# Patient Record
Sex: Female | Born: 1960
Health system: Southern US, Community
[De-identification: ages and names within clinical notes are randomized; demographics above are authoritative.]

## PROBLEM LIST (undated history)

## (undated) DIAGNOSIS — D219 Benign neoplasm of connective and other soft tissue, unspecified: Secondary | ICD-10-CM

## (undated) DIAGNOSIS — D649 Anemia, unspecified: Secondary | ICD-10-CM

## (undated) DIAGNOSIS — I10 Essential (primary) hypertension: Secondary | ICD-10-CM

## (undated) DIAGNOSIS — Z872 Personal history of diseases of the skin and subcutaneous tissue: Secondary | ICD-10-CM

## (undated) DIAGNOSIS — E785 Hyperlipidemia, unspecified: Secondary | ICD-10-CM

## (undated) HISTORY — DX: Anemia, unspecified: D64.9

## (undated) HISTORY — DX: Benign neoplasm of connective and other soft tissue, unspecified: D21.9

## (undated) HISTORY — DX: Hyperlipidemia, unspecified: E78.5

## (undated) HISTORY — PX: CYSTECTOMY: SUR359

## (undated) HISTORY — DX: Essential (primary) hypertension: I10

## (undated) HISTORY — DX: Personal history of diseases of the skin and subcutaneous tissue: Z87.2

---

## 1999-08-15 ENCOUNTER — Other Ambulatory Visit: Admission: RE | Admit: 1999-08-15 | Discharge: 1999-08-15 | Payer: Self-pay | Admitting: Obstetrics and Gynecology

## 2000-09-15 ENCOUNTER — Other Ambulatory Visit: Admission: RE | Admit: 2000-09-15 | Discharge: 2000-09-15 | Payer: Self-pay | Admitting: Obstetrics and Gynecology

## 2000-10-24 ENCOUNTER — Ambulatory Visit (HOSPITAL_COMMUNITY): Admission: RE | Admit: 2000-10-24 | Discharge: 2000-10-24 | Payer: Self-pay | Admitting: Obstetrics and Gynecology

## 2001-03-10 ENCOUNTER — Encounter: Payer: Self-pay | Admitting: Family Medicine

## 2001-03-10 ENCOUNTER — Ambulatory Visit (HOSPITAL_COMMUNITY): Admission: RE | Admit: 2001-03-10 | Discharge: 2001-03-10 | Payer: Self-pay | Admitting: Family Medicine

## 2001-09-09 ENCOUNTER — Other Ambulatory Visit: Admission: RE | Admit: 2001-09-09 | Discharge: 2001-09-09 | Payer: Self-pay | Admitting: Obstetrics and Gynecology

## 2001-10-05 ENCOUNTER — Ambulatory Visit (HOSPITAL_COMMUNITY): Admission: RE | Admit: 2001-10-05 | Discharge: 2001-10-05 | Payer: Self-pay | Admitting: Obstetrics and Gynecology

## 2001-10-05 ENCOUNTER — Encounter: Payer: Self-pay | Admitting: Obstetrics and Gynecology

## 2002-10-04 ENCOUNTER — Other Ambulatory Visit: Admission: RE | Admit: 2002-10-04 | Discharge: 2002-10-04 | Payer: Self-pay | Admitting: Obstetrics and Gynecology

## 2002-10-07 ENCOUNTER — Encounter: Payer: Self-pay | Admitting: Obstetrics and Gynecology

## 2002-10-07 ENCOUNTER — Ambulatory Visit (HOSPITAL_COMMUNITY): Admission: RE | Admit: 2002-10-07 | Discharge: 2002-10-07 | Payer: Self-pay | Admitting: Obstetrics and Gynecology

## 2003-11-22 ENCOUNTER — Other Ambulatory Visit: Admission: RE | Admit: 2003-11-22 | Discharge: 2003-11-22 | Payer: Self-pay | Admitting: Obstetrics and Gynecology

## 2003-12-07 ENCOUNTER — Ambulatory Visit (HOSPITAL_COMMUNITY): Admission: RE | Admit: 2003-12-07 | Discharge: 2003-12-07 | Payer: Self-pay | Admitting: Obstetrics and Gynecology

## 2004-12-27 ENCOUNTER — Other Ambulatory Visit: Admission: RE | Admit: 2004-12-27 | Discharge: 2004-12-27 | Payer: Self-pay | Admitting: Obstetrics and Gynecology

## 2005-01-03 ENCOUNTER — Ambulatory Visit (HOSPITAL_COMMUNITY): Admission: RE | Admit: 2005-01-03 | Discharge: 2005-01-03 | Payer: Self-pay | Admitting: Obstetrics and Gynecology

## 2005-12-30 ENCOUNTER — Other Ambulatory Visit: Admission: RE | Admit: 2005-12-30 | Discharge: 2005-12-30 | Payer: Self-pay | Admitting: Obstetrics and Gynecology

## 2006-02-25 ENCOUNTER — Ambulatory Visit (HOSPITAL_COMMUNITY): Admission: RE | Admit: 2006-02-25 | Discharge: 2006-02-25 | Payer: Self-pay | Admitting: Obstetrics and Gynecology

## 2007-03-02 ENCOUNTER — Ambulatory Visit (HOSPITAL_COMMUNITY): Admission: RE | Admit: 2007-03-02 | Discharge: 2007-03-02 | Payer: Self-pay | Admitting: Obstetrics and Gynecology

## 2008-04-07 ENCOUNTER — Ambulatory Visit (HOSPITAL_COMMUNITY): Admission: RE | Admit: 2008-04-07 | Discharge: 2008-04-07 | Payer: Self-pay | Admitting: Obstetrics and Gynecology

## 2009-05-22 ENCOUNTER — Ambulatory Visit (HOSPITAL_COMMUNITY): Admission: RE | Admit: 2009-05-22 | Discharge: 2009-05-22 | Payer: Self-pay | Admitting: Obstetrics and Gynecology

## 2009-06-02 ENCOUNTER — Encounter: Admission: RE | Admit: 2009-06-02 | Discharge: 2009-06-02 | Payer: Self-pay | Admitting: Obstetrics and Gynecology

## 2010-06-25 ENCOUNTER — Encounter
Admission: RE | Admit: 2010-06-25 | Discharge: 2010-06-25 | Payer: Self-pay | Source: Home / Self Care | Attending: Obstetrics and Gynecology | Admitting: Obstetrics and Gynecology

## 2010-07-29 ENCOUNTER — Encounter: Payer: Self-pay | Admitting: Obstetrics and Gynecology

## 2010-11-23 NOTE — Op Note (Signed)
[facility]  Patient:    Kimberly Bonilla, Kimberly Bonilla                         MRN: [id number] Proc. Date: 10/24/00 Adm. Date:  [id number] Attending:  Leonard Schwartz                           Operative Report  PREOPERATIVE DIAGNOSES:       1. Desires sterilization.                               2. Fibroids.  POSTOPERATIVE DIAGNOSES:      1. Desires sterilization.                               2. Fibroids.  PROCEDURE:                    Laparoscopic tubal cautery surgery.  SURGEON:                      Janine Limbo, M.D.  FIRST ASSISTANT:              None.  ANESTHESIA:                   General.  DISPOSITION:                  Ms. Roes is a 50 year old female, para 1-0-0-1, who desires sterilization.  She understands the indications for her procedure, and she accepts the risks of, but no limited to, anesthetic complications, bleeding, infections, and possible damage to the surrounding organs, and possible tubal failure (10-17:1,000).  FINDINGS:                     The uterus was 12-weeks size and had multiple subserosal fibroids.  The fallopian tubes and ovaries appeared normal.  The bowel, the appendix and the upper abdomen appeared normal.  PROCEDURE:                    The patient was taken to the operating room, where a general anesthetic was given.  The patients abdomen, perineum, and vagina were prepped with multiple layers of Betadine.  The bladder was drained of urine.  A Hulka tenaculum was placed inside the uterus.  The patient was sterilely draped.  The subumbilical area was injected with 5 cc of 0.5% Marcaine.  An incision was made and the Veress needle was inserted without difficulty.  Proper insufflation was confirmed using a saline drop test.  A pneumoperitoneum was obtained.  The laparoscopic trocar and then the laparoscope were substituted for the Veress needle.  The pelvic structures were visualized, with findings as mentioned  above.  The right fallopian tube was identified and followed to its fimbriated end.  The proximal portion of the right fallopian tube was cauterized in several segments.  Hemostasis was adequate.  An identical procedure was carried out on the opposite side. Again, hemostasis was adequate.  This having been completed, the bowel was carefully inspected and there was no evidence of damage.  The pneumoperitoneum was allowed to escape.  All instruments were removed.  The incision was closed using deep and superficial sutures of 4-0 Vicryl.  Sponge, needle and instrument  counts were correct on two occasions.  ESTIMATED BLOOD LOSS:         5-10 cc.  DISPOSITION:                  The patient tolerated her procedure well.  FOLLOW-UP INSTRUCTIONS:       The patient will return to see Dr. Stefano Gaul in two to three weeks for follow-up examination.  She was given a prescription for Vicodin 1-2 p.o. q.4h. p.r.n. pain.  She was given a copy of the postoperative instruction sheet, as prepared by the [facility] for patients who have undergone laparoscopy. DD:  10/24/00 TD:  10/25/00 Job: [id number]

## 2010-11-23 NOTE — H&P (Signed)
[facility]  Patient:    Kimberly Bonilla, Kimberly Bonilla                           MRN: [id number] Adm. Date:  10/24/00 Attending:  Janine Limbo, M.D.                         History and Physical  HISTORY OF PRESENT ILLNESS:   Kimberly Bonilla is a 50 year old female, para 1-0-0-1 who desires permanent sterilization.  OB/GYN HISTORY:               The patient had a vaginal delivery at term in January of 1995.  PAST MEDICAL HISTORY:         The patient had breast cysts removed in 1980 and again in 1987.  CURRENT MEDICATIONS:          Aviane. She has elevated cholesterol.  ALLERGIES:                    No known drug allergies.  SOCIAL HISTORY:               The patient drinks alcohol socially. She denies cigarette use and recreational drug use.  REVIEW OF SYSTEMS:            Noncontributory.  FAMILY HISTORY:               The patients sister has Parkinsons disease. She has several sisters with thyroid disorder. The patients mother and sisters have diabetes. The patients mother has hypertension. The patients mother had uterine cancer.  PHYSICAL EXAMINATION:  VITAL SIGNS:                  Weight 147 pounds.  HEENT:                        Within normal limits.  LUNGS:                        Chest is clear.  CARDIOVASCULAR:               Regular rate and rhythm.  BREASTS:                      Without masses.  ABDOMEN:                      Nontender.  EXTREMITIES:                  Within normal limits.  NEUROLOGIC:                   Normal exam.  PELVIC:                       External genitalia is normal. Vaginal is normal. Cervix is nontender. Uterus is normal size, shape and consistency. Adnexa no masses. Rectovaginal exam confirms.  ASSESSMENT:                   Desires permanent sterilization.  PLAN:                         The patient will undergo a laparoscopic tubal cautery. She understands the indications for this procedure and she accepts the  risks of, but not limited  to anesthetic complications, bleeding, infections and possible damage to the surrounding organs. DD:  10/23/00 TD:  10/23/00 Job: [id number]

## 2011-06-04 ENCOUNTER — Other Ambulatory Visit: Payer: Self-pay | Admitting: Obstetrics and Gynecology

## 2011-06-04 DIAGNOSIS — Z1231 Encounter for screening mammogram for malignant neoplasm of breast: Secondary | ICD-10-CM

## 2011-06-27 ENCOUNTER — Ambulatory Visit
Admission: RE | Admit: 2011-06-27 | Discharge: 2011-06-27 | Disposition: A | Discharge status: Home / Self Care | Payer: Private Health Insurance - Indemnity | Source: Ambulatory Visit | Attending: Obstetrics and Gynecology | Admitting: Obstetrics and Gynecology

## 2011-06-27 DIAGNOSIS — Z1231 Encounter for screening mammogram for malignant neoplasm of breast: Secondary | ICD-10-CM

## 2012-06-08 ENCOUNTER — Ambulatory Visit: Payer: Self-pay | Admitting: Obstetrics and Gynecology

## 2012-07-23 ENCOUNTER — Ambulatory Visit: Payer: Self-pay | Admitting: Obstetrics and Gynecology

## 2012-08-19 ENCOUNTER — Ambulatory Visit: Payer: Self-pay | Admitting: Obstetrics and Gynecology

## 2012-09-03 ENCOUNTER — Encounter: Payer: Self-pay | Admitting: Obstetrics and Gynecology

## 2012-09-03 ENCOUNTER — Ambulatory Visit: Payer: Managed Care, Other (non HMO) | Admitting: Obstetrics and Gynecology

## 2012-09-03 VITALS — BP 128/80 | Resp 18 | Ht 66.0 in | Wt 178.0 lb

## 2012-09-03 DIAGNOSIS — E663 Overweight: Secondary | ICD-10-CM | POA: Insufficient documentation

## 2012-09-03 DIAGNOSIS — I10 Essential (primary) hypertension: Secondary | ICD-10-CM | POA: Insufficient documentation

## 2012-09-03 DIAGNOSIS — N898 Other specified noninflammatory disorders of vagina: Secondary | ICD-10-CM

## 2012-09-03 DIAGNOSIS — Z01419 Encounter for gynecological examination (general) (routine) without abnormal findings: Secondary | ICD-10-CM

## 2012-09-03 DIAGNOSIS — Z124 Encounter for screening for malignant neoplasm of cervix: Secondary | ICD-10-CM

## 2012-09-03 LAB — POCT WET PREP (WET MOUNT): Whiff Test: POSITIVE

## 2012-09-03 MED ORDER — METRONIDAZOLE 500 MG PO TABS: 500.0000 mg | ORAL_TABLET | Freq: Two times a day (BID) | ORAL | Status: DC

## 2012-09-03 MED ORDER — METRONIDAZOLE 500 MG PO TABS: 500.0000 mg | ORAL_TABLET | Freq: Two times a day (BID) | ORAL | Status: AC

## 2012-09-03 NOTE — Patient Instructions (Signed)
Bacterial Vaginosis Bacterial vaginosis (BV) is a vaginal infection where the normal balance of bacteria in the vagina is disrupted. The normal balance is then replaced by an overgrowth of certain bacteria. There are several different kinds of bacteria that can cause BV. BV is the most common vaginal infection in women of childbearing age. CAUSES   The cause of BV is not fully understood. BV develops when there is an increase or imbalance of harmful bacteria.  Some activities or behaviors can upset the normal balance of bacteria in the vagina and put women at increased risk including:  Having a new sex partner or multiple sex partners.  Douching.  Using an intrauterine device (IUD) for contraception.  It is not clear what role sexual activity plays in the development of BV. However, women that have never had sexual intercourse are rarely infected with BV. Women do not get BV from toilet seats, bedding, swimming pools or from touching objects around them.  SYMPTOMS   Grey vaginal discharge.  A fish-like odor with discharge, especially after sexual intercourse.  Itching or burning of the vagina and vulva.  Burning or pain with urination.  Some women have no signs or symptoms at all. DIAGNOSIS  Your caregiver must examine the vagina for signs of BV. Your caregiver will perform lab tests and look at the sample of vaginal fluid through a microscope. They will look for bacteria and abnormal cells (clue cells), a pH test higher than 4.5, and a positive amine test all associated with BV.  RISKS AND COMPLICATIONS   Pelvic inflammatory disease (PID).  Infections following gynecology surgery.  Developing HIV.  Developing herpes virus. TREATMENT  Sometimes BV will clear up without treatment. However, all women with symptoms of BV should be treated to avoid complications, especially if gynecology surgery is planned. Female partners generally do not need to be treated. However, BV may spread  between female sex partners so treatment is helpful in preventing a recurrence of BV.   BV may be treated with antibiotics. The antibiotics come in either pill or vaginal cream forms. Either can be used with nonpregnant or pregnant women, but the recommended dosages differ. These antibiotics are not harmful to the baby.  BV can recur after treatment. If this happens, a second round of antibiotics will often be prescribed.  Treatment is important for pregnant women. If not treated, BV can cause a premature delivery, especially for a pregnant woman who had a premature birth in the past. All pregnant women who have symptoms of BV should be checked and treated.  For chronic reoccurrence of BV, treatment with a type of prescribed gel vaginally twice a week is helpful. HOME CARE INSTRUCTIONS   Finish all medication as directed by your caregiver.  Do not have sex until treatment is completed.  Tell your sexual partner that you have a vaginal infection. They should see their caregiver and be treated if they have problems, such as a mild rash or itching.  Practice safe sex. Use condoms. Only have 1 sex partner. PREVENTION  Basic prevention steps can help reduce the risk of upsetting the natural balance of bacteria in the vagina and developing BV:  Do not have sexual intercourse (be abstinent).  Do not douche.  Use all of the medicine prescribed for treatment of BV, even if the signs and symptoms go away.  Tell your sex partner if you have BV. That way, they can be treated, if needed, to prevent reoccurrence. SEEK MEDICAL CARE IF:     Your symptoms are not improving after 3 days of treatment.  You have increased discharge, pain, or fever. MAKE SURE YOU:   Understand these instructions.  Will watch your condition.  Will get help right away if you are not doing well or get worse. FOR MORE INFORMATION  Division of STD Prevention (DSTDP), Centers for Disease Control and Prevention:  www.cdc.gov/std American Social Health Association (ASHA): www.ashastd.org  Document Released: 06/24/2005 Document Revised: 09/16/2011 Document Reviewed: 12/15/2008 Exit[employer] Patient Information 2013 [employer].  

## 2012-09-03 NOTE — Progress Notes (Signed)
ANNUAL GYNECOLOGIC EXAMINATION   Kimberly Bonilla is a 52 y.o. female, G1P0, who presents for an annual exam. She has a known history of fibroids.  With her last menstrual cycle she had spotting after her normal period. She now has a vaginal discharge with an odor.  She has trouble sleeping.  Minimal hot flashes.    History   Social History  . Marital Status: Married    Spouse Name: N/A    Number of Children: N/A  . Years of Education: N/A   Social History Main Topics  . Smoking status: Never Smoker   . Smokeless tobacco: Never Used  . Alcohol Use: No  . Drug Use: No  . Sexually Active: Yes -- Female partner(s)    Birth Control/ Protection: Surgical     Comment: btl   Other Topics Concern  . None   Social History Narrative  . None    Menstrual cycle:   LMP: No LMP recorded.             The following portions of the patient's history were reviewed and updated as appropriate: allergies, current medications, past family history, past medical history, past social history, past surgical history and problem list.  Review of Systems Pertinent items are noted in HPI. Breast:Negative for breast lump,nipple discharge or nipple retraction Gastrointestinal: Negative for abdominal pain, change in bowel habits or rectal bleeding Urinary: some stress urinary incontinence   Objective:    BP 128/80  Resp 18  Ht 5\' 6"  (1.676 m)  Wt 178 lb (80.74 kg)  BMI 28.74 kg/m2    Weight:  Wt Readings from Last 1 Encounters:  09/03/12 178 lb (80.74 kg)          BMI: Body mass index is 28.74 kg/(m^2).  General Appearance: Alert, appropriate appearance for age. No acute distress HEENT: Grossly normal Neck / Thyroid: Supple, no masses, nodes or enlargement Lungs: clear to auscultation bilaterally Back: No CVA tenderness Breast Exam: No masses or nodes.No dimpling, nipple retraction or discharge. Cardiovascular: Regular rate and rhythm. S1, S2, no murmur Gastrointestinal: Soft, non-tender, no  masses or organomegaly  ++++++++++++++++++++++++++++++++++++++++++++++++++++++++  Pelvic Exam: External genitalia: normal general appearance Vaginal: normal without tenderness, induration or masses. Relaxation: Yes Cervix: normal appearance Adnexa: normal bimanual exam Uterus: 10 weeks size, firm Rectovaginal: no masses  ++++++++++++++++++++++++++++++++++++++++++++++++++++++++  Lymphatic Exam: Non-palpable nodes in neck, clavicular, axillary, or inguinal regions Neurologic: Normal speech, no tremor  Psychiatric: Alert and oriented, appropriate affect.   Wet prep:Whiff positive, pH 5.5, clue cells present, negative trichomoniasis Assessment:    Normal gyn exam   Overweight or obese: Yes   Pelvic relaxation: Yes  Fibroid uterus  Perimenopause  Bacterial vaginosis  Spotting after her cycle  hypertension   Plan:    mammogram pap smear return annually or prn Contraception:bilateral tubal ligation    Medications prescribed: Metronidazole 500 mg twice a day for 7 days  STD screen request: Yes, gonorrhea and Chlamydia  The patient will keep a calendar of her menstrual cycles.  If she continues to have spotting immediately after her cycle, then she will return so that we can do an ultrasound.  An ultrasound was offered today.  She is comfortable observing only for the moment.  The updated Pap smear screening guidelines were discussed with the patient. The patient requested that I obtain a Pap smear: Yes.  Kegel exercises discussed: Yes.  Proper diet and regular exercise were reviewed.  Annual mammograms recommended starting at age 76. Proper  breast care was discussed.  Screening colonoscopy is recommended beginning at age 79.  Regular health maintenance was reviewed.  Sleep hygiene was discussed.  Adequate calcium and vitamin D intake was emphasized.  Leonard Schwartz M.D.    Regular Periods: yes last cycle was irregular unable to predict it.   Mammogram: no  Monthly Breast Ex.: no Exercise: yes "Walking during breaks at work x 5 days a week"  Tetanus < 10 years: yes Seatbelts: yes  NI. Bladder Functn.: yes "leaking w/cough/laughing" Abuse at home: no  Daily BM's: yes Stressful Work: no  Healthy Diet: yes Sigmoid-Colonoscopy: never per pt   Calcium: yes Medical problems this year: Vaginal discharge, spotting.    LAST PAP:05/2010 due today   Contraception: BTL   Mammogram:  2013 "WNL"  PCP: Noberto Retort  PMH: Unchanged  FMH: Unchanged  Last Bone Scan: Never per pt.

## 2012-09-04 ENCOUNTER — Other Ambulatory Visit: Payer: Self-pay | Admitting: Obstetrics and Gynecology

## 2012-09-04 ENCOUNTER — Other Ambulatory Visit: Payer: Self-pay

## 2012-09-04 DIAGNOSIS — Z1231 Encounter for screening mammogram for malignant neoplasm of breast: Secondary | ICD-10-CM

## 2012-09-04 LAB — PAP IG, CT-NG, RFX HPV ASCU: Chlamydia Probe Amp: NEGATIVE

## 2012-09-22 ENCOUNTER — Ambulatory Visit
Admission: RE | Admit: 2012-09-22 | Discharge: 2012-09-22 | Disposition: A | Discharge status: Home / Self Care | Payer: Managed Care, Other (non HMO) | Source: Ambulatory Visit

## 2012-09-22 DIAGNOSIS — Z1231 Encounter for screening mammogram for malignant neoplasm of breast: Secondary | ICD-10-CM

## 2013-11-02 ENCOUNTER — Other Ambulatory Visit: Payer: Self-pay

## 2013-11-02 DIAGNOSIS — Z1231 Encounter for screening mammogram for malignant neoplasm of breast: Secondary | ICD-10-CM

## 2013-11-17 ENCOUNTER — Ambulatory Visit
Admission: RE | Admit: 2013-11-17 | Discharge: 2013-11-17 | Disposition: A | Discharge status: Home / Self Care | Payer: Managed Care, Other (non HMO) | Source: Ambulatory Visit

## 2013-11-17 ENCOUNTER — Encounter (INDEPENDENT_AMBULATORY_CARE_PROVIDER_SITE_OTHER): Payer: Self-pay

## 2013-11-17 DIAGNOSIS — Z1231 Encounter for screening mammogram for malignant neoplasm of breast: Secondary | ICD-10-CM

## 2014-05-09 ENCOUNTER — Encounter: Payer: Self-pay | Admitting: Obstetrics and Gynecology

## 2014-10-14 ENCOUNTER — Other Ambulatory Visit: Payer: Self-pay

## 2014-10-14 DIAGNOSIS — Z1231 Encounter for screening mammogram for malignant neoplasm of breast: Secondary | ICD-10-CM

## 2014-11-25 ENCOUNTER — Ambulatory Visit: Payer: Managed Care, Other (non HMO)

## 2014-12-08 ENCOUNTER — Ambulatory Visit: Payer: Managed Care, Other (non HMO)

## 2014-12-26 ENCOUNTER — Ambulatory Visit
Admission: RE | Admit: 2014-12-26 | Discharge: 2014-12-26 | Disposition: A | Discharge status: Home / Self Care | Payer: BLUE CROSS/BLUE SHIELD | Source: Ambulatory Visit

## 2014-12-26 DIAGNOSIS — Z1231 Encounter for screening mammogram for malignant neoplasm of breast: Secondary | ICD-10-CM

## 2015-10-17 DIAGNOSIS — Z0000 Encounter for general adult medical examination without abnormal findings: Secondary | ICD-10-CM | POA: Diagnosis not present

## 2015-10-17 DIAGNOSIS — E782 Mixed hyperlipidemia: Secondary | ICD-10-CM | POA: Diagnosis not present

## 2015-10-17 DIAGNOSIS — I10 Essential (primary) hypertension: Secondary | ICD-10-CM | POA: Diagnosis not present

## 2015-10-17 DIAGNOSIS — K219 Gastro-esophageal reflux disease without esophagitis: Secondary | ICD-10-CM | POA: Diagnosis not present

## 2015-11-10 ENCOUNTER — Ambulatory Visit: Payer: Worker's Compensation | Admitting: Family Medicine

## 2015-11-10 ENCOUNTER — Ambulatory Visit: Payer: BLUE CROSS/BLUE SHIELD

## 2015-11-10 VITALS — BP 140/86 | HR 80 | Temp 98.8°F | Resp 16 | Ht 66.0 in | Wt 185.0 lb

## 2015-11-10 DIAGNOSIS — S8991XA Unspecified injury of right lower leg, initial encounter: Secondary | ICD-10-CM | POA: Diagnosis not present

## 2015-11-10 MED ORDER — ACETAMINOPHEN-CODEINE #3 300-30 MG PO TABS: 1.0000 | ORAL_TABLET | Freq: Four times a day (QID) | ORAL | Status: AC | PRN

## 2015-11-10 NOTE — Patient Instructions (Addendum)
Elastic Bandage and RICE WHAT DOES AN ELASTIC BANDAGE DO? Elastic bandages come in different shapes and sizes. They generally provide support to your injury and reduce swelling while you are healing, but they can perform different functions. Your health care provider will help you to decide what is best for your protection, recovery, or rehabilitation following an injury. WHAT ARE SOME GENERAL TIPS FOR USING AN ELASTIC BANDAGE?  Use the bandage as directed by the maker of the bandage that you are using.  Do not wrap the bandage too tightly. This may cut off the circulation in the arm or leg in the area below the bandage.  If part of your body beyond the bandage becomes blue, numb, cold, swollen, or is more painful, your bandage is most likely too tight. If this occurs, remove your bandage and reapply it more loosely.  See your health care provider if the bandage seems to be making your problems worse rather than better.  An elastic bandage should be removed and reapplied every 3-4 hours or as directed by your health care provider. WHAT IS RICE? The routine care of many injuries includes rest, ice, compression, and elevation (RICE therapy).  Rest Rest is required to allow your body to heal. Generally, you can resume your routine activities when you are comfortable and have been given permission by your health care provider. Ice Icing your injury helps to keep the swelling down and it reduces pain. Do not apply ice directly to your skin.  Put ice in a plastic bag.  Place a towel between your skin and the bag.  Leave the ice on for 20 minutes, 2-3 times per day. Do this for as long as you are directed by your health care provider. Compression Compression helps to keep swelling down, gives support, and helps with discomfort. Compression may be done with an elastic bandage. Elevation Elevation helps to reduce swelling and it decreases pain. If possible, your injured area should be placed  at or above the level of your heart or the center of your chest. [place]? You should seek medical care if:  You have persistent pain and swelling.  Your symptoms are getting worse rather than improving. These symptoms may indicate that further evaluation or further X-rays are needed. Sometimes, X-rays may not show a small broken bone (fracture) until a number of days later. Make a follow-up appointment with your health care provider. Ask when your X-ray results will be ready. Make sure that you get your X-ray results. WHEN SHOULD I SEEK IMMEDIATE MEDICAL CARE? You should seek immediate medical care if:  You have a sudden onset of severe pain at or below the area of your injury.  You develop redness or increased swelling around your injury.  You have tingling or numbness at or below the area of your injury that does not improve after you remove the elastic bandage.   This information is not intended to replace advice given to you by your health care provider. Make sure you discuss any questions you have with your health care provider.   Document Released: 12/14/2001 Document Revised: 03/15/2015 Document Reviewed: 02/07/2014 Elsevier Interactive Patient Education 2016 [employer].    IF you received an x-ray today, you will receive an invoice from [facility]. Please contact [facility] at [phone] with questions or concerns regarding your invoice.   IF you received labwork today, you will receive an invoice from [employer]. Please contact [facility] at [phone]  with questions or concerns regarding your invoice.   Our billing staff will not be able to assist you with questions regarding bills from these companies.  You will be contacted with the lab results as soon as they are available. The fastest way to get your results is to activate your My Chart account. Instructions are located on the last page of this  paperwork. If you have not heard from [place] regarding the results in 2 weeks, please contact this office.     Prepatellar Bursitis With Rehab  Bursitis is a condition that is characterized by inflammation of a bursa. Saunders Revel exists in many areas of the body. They are fluid-filled sacs that lie between a soft tissue (skin, tendon, or ligament) and a bone, and they reduce friction between the structures as well as the stress placed on the soft tissue. Prepatellar bursitis is inflammation of the bursa that lies between the skin and the kneecap (patella). This condition often causes pain over the patella. SYMPTOMS   Pain, tenderness, and/or inflammation over the patella.  Pain that worsens with movement of the knee joint.  Decreased range of motion for the knee joint.  A crackling sound (crepitation) when the bursa is moved or touched.  Occasionally, painless swelling of the bursa.  Fever (when infected). CAUSES  Bursitis is caused by damage to the bursa, which results in an inflammatory response. Common mechanisms of injury include:  Direct trauma to the front of the knee.  Repetitive and/or stressful use of the knee. RISK INCREASES WITH:  Activities in which kneeling and/or falling on one's knees is likely (volleyball or football).  Repetitive and stressful training, especially if it involves running on hills.  Improper training techniques, such as a sudden increase in the intensity, frequency, or duration of training.  Failure to warm up properly before activity.  Poor technique.  Artificial turf. PREVENTION   Avoid kneeling or falling on your knees.  Warm up and stretch properly before activity.  Allow for adequate recovery between workouts.  Maintain physical fitness:  Strength, flexibility, and endurance.  Cardiovascular fitness.  Learn and use proper technique. When possible, have a coach correct improper technique.  Wear properly fitted and padded protective  equipment (knee pads). PROGNOSIS  If treated properly, then the symptoms of prepatellar bursitis usually resolve within 2 weeks. RELATED COMPLICATIONS   Recurrent symptoms that result in a chronic problem.  Prolonged healing time, if improperly treated or reinjured.  Limited range of motion.  Infection of bursa.  Chronic inflammation or scarring of bursa. TREATMENT  Treatment initially involves the use of ice and medication to help reduce pain and inflammation. The use of strengthening and stretching exercises may help reduce pain with activity, especially those of the quadriceps and hamstring muscles. These exercises may be performed at home or with referral to a therapist. Your caregiver may recommend knee pads when you return to playing sports, in order to reduce the stress on the prepatellar bursa. If symptoms persist despite treatment, then your caregiver may drain fluid out with a needle (aspirate) the bursa. If symptoms persist for greater than 6 months despite nonsurgical (conservative) treatment, then surgery may be recommended to remove the bursa.  MEDICATION  If pain medication is necessary, then nonsteroidal anti-inflammatory medications, such as aspirin and ibuprofen, or other minor pain relievers, such as acetaminophen, are often recommended.  Do not take pain medication for 7 days before surgery.  Prescription pain relievers may be given if deemed necessary by your caregiver.  Use only as directed and only as much as you need.  Corticosteroid injections may be given by your caregiver. These injections should be reserved for the most serious cases, because they may only be given a certain number of times. HEAT AND COLD  Cold treatment (icing) relieves pain and reduces inflammation. Cold treatment should be applied for 10 to 15 minutes every 2 to 3 hours for inflammation and pain and immediately after any activity that aggravates your symptoms. Use ice packs or massage the area  with a piece of ice (ice massage).  Heat treatment may be used prior to performing the stretching and strengthening activities prescribed by your caregiver, physical therapist, or athletic trainer. Use a heat pack or soak the injury in warm water. SEEK MEDICAL CARE IF:  Treatment seems to offer no benefit, or the condition worsens.  Any medications produce adverse side effects. EXERCISES RANGE OF MOTION (ROM) AND STRETCHING EXERCISES - Prepatellar Bursitis These exercises may help you when beginning to rehabilitate your injury. Your symptoms may resolve with or without further involvement from your physician, physical therapist or athletic trainer. While completing these exercises, remember:   Restoring tissue flexibility helps normal motion to return to the joints. This allows healthier, less painful movement and activity.  An effective stretch should be held for at least 30 seconds.  A stretch should never be painful. You should only feel a gentle lengthening or release in the stretched tissue. STRETCH - Hamstrings, Standing  Stand or sit and extend your right / left leg, placing your foot on a chair or foot stool  Keeping a slight arch in your low back and your hips straight forward.  Lead with your chest and lean forward at the waist until you feel a gentle stretch in the back of your right / left knee or thigh. (When done correctly, this exercise requires leaning only a small distance.)  Hold this position for __________ seconds. Repeat __________ times. Complete this stretch __________ times per day. STRETCH - Quadriceps, Prone   Lie on your stomach on a firm surface, such as a bed or padded floor.  Bend your right / left knee and grasp your ankle. If you are unable to reach, your ankle or pant leg, use a belt around your foot to lengthen your reach.  Gently pull your heel toward your buttocks. Your knee should not slide out to the side. You should feel a stretch in the front  of your thigh and/or knee.  Hold this position for __________ seconds. Repeat __________ times. Complete this stretch __________ times per day.  STRETCH - Hamstrings/Adductors, V-Sit   Sit on the floor with your legs extended in a large "V," keeping your knees straight.  With your head and chest upright, bend at your waist reaching for your right foot to stretch your left adductors.  You should feel a stretch in your left inner thigh. Hold for __________ seconds.  Return to the upright position to relax your leg muscles.  Continuing to keep your chest upright, bend straight forward at your waist to stretch your hamstrings.  You should feel a stretch behind both of your thighs and/or knees. Hold for __________ seconds.  Return to the upright position to relax your leg muscles.  Repeat steps 2 through 4. Repeat __________ times. Complete this exercise __________ times per day.  STRENGTHENING EXERCISES - Prepatellar Bursitis  These exercises may help you when beginning to rehabilitate your injury. They may resolve your symptoms with or  without further involvement from your physician, physical therapist or athletic trainer. While completing these exercises, remember:  Muscles can gain both the endurance and the strength needed for everyday activities through controlled exercises.  Complete these exercises as instructed by your physician, physical therapist or athletic trainer. Progress the resistance and repetitions only as guided. STRENGTH - Quadriceps, Isometrics  Lie on your back with your right / left leg extended and your opposite knee bent.  Gradually tense the muscles in the front of your right / left thigh. You should see either your kneecap slide up toward your hip or increased dimpling just above the knee. This motion will push the back of the knee down toward the floor/mat/bed on which you are lying.  Hold the muscle as tight as you can without increasing your pain for  __________ seconds.  Relax the muscles slowly and completely in between each repetition. Repeat __________ times. Complete this exercise __________ times per day.  STRENGTH - Quadriceps, Short Arcs   Lie on your back. Place a __________ inch towel roll under your knee so that the knee slightly bends.  Raise only your lower leg by tightening the muscles in the front of your thigh. Do not allow your thigh to rise.  Hold this position for __________ seconds. Repeat __________ times. Complete this exercise __________ times per day.  OPTIONAL ANKLE WEIGHTS: Begin with ____________________, but DO NOT exceed ____________________. Increase in1 lb/0.5 kg increments.  STRENGTH - Quadriceps, Straight Leg Raises  Quality counts! Watch for signs that the quadriceps muscle is working to insure you are strengthening the correct muscles and not "cheating" by substituting with healthier muscles.  Lay on your back with your right / left leg extended and your opposite knee bent.  Tense the muscles in the front of your right / left thigh. You should see either your kneecap slide up or increased dimpling just above the knee. Your thigh may even quiver.  Tighten these muscles even more and raise your leg 4 to 6 inches off the floor. Hold for __________ seconds.  Keeping these muscles tense, lower your leg.  Relax the muscles slowly and completely in between each repetition. Repeat __________ times. Complete this exercise __________ times per day.  STRENGTH - Quadriceps, Step-Ups   Use a thick book, step or step stool that is __________ inches tall.  Holding a [place] or counter for balance only, not support.  Slowly step-up with your right / left foot, keeping your knee in line with your hip and foot. Do not allow your knee to bend so far that you cannot see your toes.  Slowly unlock your knee and lower yourself to the starting position. Your muscles, not gravity, should lower you. Repeat __________  times. Complete this exercise __________ times per day.   This information is not intended to replace advice given to you by your health care provider. Make sure you discuss any questions you have with your health care provider.   Document Released: 06/24/2005 Document Revised: 03/15/2015 Document Reviewed: 10/06/2008 Elsevier Interactive Patient Education [employer].

## 2015-11-10 NOTE — Progress Notes (Signed)
Subjective:  By signing my name below, I, Raven Small, attest that this documentation has been prepared under the direction and in the presence of Delman Cheadle, MD.  Electronically Signed: Thea Alken, ED Scribe. 11/10/2015. 6:23 PM.   Patient ID: Kimberly Bonilla, female    DOB: 05/01/1961, 55 y.o.   MRN: [id number]  HPI Chief Complaint  Patient presents with  . Knee Injury    Right knee, fell today    HPI Comments: Kimberly Bonilla is a 55 y.o. female who presents to the [facility] complaining of right knee injury that occurred 8 hours ago. Pt states she fell and injured her knee while walking on a side walk. She was able to ambulate after fail but had a limp with gait. Since fall she reports gradually worsening pain since injury. After injury pt iced knee but has not taken anti inflammatories. Pt denies prior knee pain. She denies syncope.   Patient Active Problem List   Diagnosis Date Noted  . Hypertension 09/03/2012  . Overweight (BMI 25.0-29.9) 09/03/2012   Past Medical History  Diagnosis Date  . Fibroids     h/o  . H/O cyst of breast   . Anemia   . Hypertension   . Hyperlipidemia     controlled w/medication   Past Surgical History  Procedure Laterality Date  . Cystectomy  [id number]    in breast   No Known Allergies Prior to Admission medications   Medication Sig Start Date End Date Taking? Authorizing Provider  amLODipine-benazepril (LOTREL) 5-10 MG capsule Take 1 capsule by mouth daily.   Yes Historical Provider, MD  fish oil-omega-3 fatty acids 1000 MG capsule Take 2 g by mouth daily.   Yes Historical Provider, MD  lisinopril (PRINIVIL,ZESTRIL) 5 MG tablet Take 5 mg by mouth daily.   Yes Historical Provider, MD  Multiple Vitamin (MULTIVITAMIN WITH MINERALS) TABS Take 1 tablet by mouth daily.   Yes Historical Provider, MD  simvastatin (ZOCOR) 20 MG tablet Take 20 mg by mouth every evening. Reported on 11/10/2015   Yes Historical Provider, MD  calcium  carbonate 200 MG capsule Take 250 mg by mouth 2 (two) times daily with a meal. Reported on 11/10/2015    Historical Provider, MD   Social History   Social History  . Marital Status: Married    Spouse Name: N/A  . Number of Children: N/A  . Years of Education: N/A   Occupational History  . Not on file.   Social History Main Topics  . Smoking status: Never Smoker   . Smokeless tobacco: Never Used  . Alcohol Use: No  . Drug Use: No  . Sexual Activity:    Partners: Male    Birth Control/ Protection: Surgical     Comment: btl   Other Topics Concern  . Not on file   Social History Narrative    Review of Systems  Constitutional: Negative for fever and chills.  Musculoskeletal: Positive for myalgias, arthralgias and gait problem.  Skin: Negative for color change, rash and wound.  Neurological: Negative for syncope, weakness and numbness.    Objective:   Physical Exam  Constitutional: She is oriented to person, place, and time. She appears well-developed and well-nourished. No distress.  HENT:  Head: Normocephalic and atraumatic.  Eyes: Conjunctivae and EOM are normal.  Neck: Neck supple.  Cardiovascular: Normal rate.   Pulmonary/Chest: Effort normal.  Musculoskeletal: Normal range of motion.  Pre patellar effusion. Some abrasion over patella.  Full ROM on right knee. Tender right on top of patella. No swelling of the popliteal fossa. No tender of medial or lateral joint line. No tenderness of MCL or LCL  Neurological: She is alert and oriented to person, place, and time.  Skin: Skin is warm and dry.  Psychiatric: She has a normal mood and affect. Her behavior is normal.  Nursing note and vitals reviewed.  Filed Vitals:   11/10/15 1740  BP: 140/86  Pulse: 80  Temp: 98.8 F (37.1 C)  TempSrc: Oral  Resp: 16  Height: 5\' 6"  (1.676 m)  Weight: 185 lb (83.915 kg)  SpO2: 100%    Dg Knee Complete 4 Views Right  11/10/2015  CLINICAL DATA:  Knee injury from a fall today  EXAM: RIGHT KNEE - COMPLETE 4+ VIEW COMPARISON:  None. FINDINGS: There is no evidence of fracture, dislocation, or joint effusion. There is no evidence of arthropathy or other focal bone abnormality. Soft tissues are unremarkable. IMPRESSION: Negative. Electronically Signed   By: Andreas Newport M.D.   On: 11/10/2015 18:44    Assessment & Plan:   1. Right knee injury, initial encounter   xray nml - suspect prepatellar bursitis due to fall onto knee today - reviewed rice x 2-3d while minimizing amount of weightbearing, start otc NSAIDs (ibuprofen 600 tid or naproxen 500 bid - pt does state her HTN is well controlled, does not increase sig with NSAIDs, and that her kidney function is nml. RTC if no sig improvement in 2 wks or still w/ pain in 4 wks.  Orders Placed This Encounter  Procedures  . DG Knee Complete 4 Views Right    Standing Status: Future     Number of Occurrences: 1     Standing Expiration Date: 11/09/2016    Order Specific Question:  Reason for Exam (SYMPTOM  OR DIAGNOSIS REQUIRED)    Answer:  injury - fall with direct blow onto patella - prepatellar effusion and pain - concern for patellar fracture    Order Specific Question:  Is the patient pregnant?    Answer:  No    Order Specific Question:  Preferred imaging location?    Answer:  External    Meds ordered this encounter  Medications  . amLODipine-benazepril (LOTREL) 5-10 MG capsule    Sig: Take 1 capsule by mouth daily.  Marland Kitchen acetaminophen-codeine (TYLENOL #3) 300-30 MG tablet    Sig: Take 1-2 tablets by mouth every 6 (six) hours as needed for moderate pain.    Dispense:  30 tablet    Refill:  0    I personally performed the services described in this documentation, which was scribed in my presence. The recorded information has been reviewed and considered, and addended by me as needed.  Delman Cheadle, MD MPH

## 2016-01-02 DIAGNOSIS — Z01411 Encounter for gynecological examination (general) (routine) with abnormal findings: Secondary | ICD-10-CM | POA: Diagnosis not present

## 2016-01-02 DIAGNOSIS — Z1231 Encounter for screening mammogram for malignant neoplasm of breast: Secondary | ICD-10-CM | POA: Diagnosis not present

## 2016-01-02 DIAGNOSIS — Z6828 Body mass index (BMI) 28.0-28.9, adult: Secondary | ICD-10-CM | POA: Diagnosis not present

## 2016-01-02 DIAGNOSIS — N898 Other specified noninflammatory disorders of vagina: Secondary | ICD-10-CM | POA: Diagnosis not present

## 2016-01-02 DIAGNOSIS — Z124 Encounter for screening for malignant neoplasm of cervix: Secondary | ICD-10-CM | POA: Diagnosis not present

## 2016-04-23 DIAGNOSIS — Z23 Encounter for immunization: Secondary | ICD-10-CM | POA: Diagnosis not present

## 2016-04-23 DIAGNOSIS — I10 Essential (primary) hypertension: Secondary | ICD-10-CM | POA: Diagnosis not present

## 2016-04-23 DIAGNOSIS — E782 Mixed hyperlipidemia: Secondary | ICD-10-CM | POA: Diagnosis not present

## 2016-08-24 DIAGNOSIS — Z713 Dietary counseling and surveillance: Secondary | ICD-10-CM | POA: Diagnosis not present

## 2016-08-24 DIAGNOSIS — Z136 Encounter for screening for cardiovascular disorders: Secondary | ICD-10-CM | POA: Diagnosis not present

## 2016-08-24 DIAGNOSIS — Z13220 Encounter for screening for lipoid disorders: Secondary | ICD-10-CM | POA: Diagnosis not present

## 2016-08-24 DIAGNOSIS — Z6828 Body mass index (BMI) 28.0-28.9, adult: Secondary | ICD-10-CM | POA: Diagnosis not present

## 2016-10-15 DIAGNOSIS — H4301 Vitreous prolapse, right eye: Secondary | ICD-10-CM | POA: Diagnosis not present

## 2016-11-12 DIAGNOSIS — Z1159 Encounter for screening for other viral diseases: Secondary | ICD-10-CM | POA: Diagnosis not present

## 2016-11-12 DIAGNOSIS — I10 Essential (primary) hypertension: Secondary | ICD-10-CM | POA: Diagnosis not present

## 2016-11-12 DIAGNOSIS — E782 Mixed hyperlipidemia: Secondary | ICD-10-CM | POA: Diagnosis not present

## 2016-11-12 DIAGNOSIS — Z0000 Encounter for general adult medical examination without abnormal findings: Secondary | ICD-10-CM | POA: Diagnosis not present

## 2016-11-13 DIAGNOSIS — I10 Essential (primary) hypertension: Secondary | ICD-10-CM | POA: Diagnosis not present

## 2016-11-13 DIAGNOSIS — E782 Mixed hyperlipidemia: Secondary | ICD-10-CM | POA: Diagnosis not present

## 2016-11-13 DIAGNOSIS — Z1159 Encounter for screening for other viral diseases: Secondary | ICD-10-CM | POA: Diagnosis not present

## 2016-11-26 DIAGNOSIS — H4301 Vitreous prolapse, right eye: Secondary | ICD-10-CM | POA: Diagnosis not present

## 2017-01-07 DIAGNOSIS — Z6829 Body mass index (BMI) 29.0-29.9, adult: Secondary | ICD-10-CM | POA: Diagnosis not present

## 2017-01-07 DIAGNOSIS — Z124 Encounter for screening for malignant neoplasm of cervix: Secondary | ICD-10-CM | POA: Diagnosis not present

## 2017-01-07 DIAGNOSIS — Z01411 Encounter for gynecological examination (general) (routine) with abnormal findings: Secondary | ICD-10-CM | POA: Diagnosis not present

## 2017-01-07 DIAGNOSIS — Z1231 Encounter for screening mammogram for malignant neoplasm of breast: Secondary | ICD-10-CM | POA: Diagnosis not present

## 2017-03-18 DIAGNOSIS — M65311 Trigger thumb, right thumb: Secondary | ICD-10-CM | POA: Diagnosis not present

## 2017-05-15 DIAGNOSIS — Z23 Encounter for immunization: Secondary | ICD-10-CM | POA: Diagnosis not present

## 2017-05-15 DIAGNOSIS — I10 Essential (primary) hypertension: Secondary | ICD-10-CM | POA: Diagnosis not present

## 2017-05-15 DIAGNOSIS — E782 Mixed hyperlipidemia: Secondary | ICD-10-CM | POA: Diagnosis not present

## 2017-06-03 DIAGNOSIS — R002 Palpitations: Secondary | ICD-10-CM | POA: Diagnosis not present

## 2017-06-13 ENCOUNTER — Emergency Department (HOSPITAL_COMMUNITY)
Admission: EM | Admit: 2017-06-13 | Discharge: 2017-06-14 | Disposition: A | Discharge status: Home / Self Care | Payer: BLUE CROSS/BLUE SHIELD | Attending: Emergency Medicine | Admitting: Emergency Medicine

## 2017-06-13 ENCOUNTER — Encounter (HOSPITAL_COMMUNITY): Payer: Self-pay | Admitting: Emergency Medicine

## 2017-06-13 DIAGNOSIS — R202 Paresthesia of skin: Secondary | ICD-10-CM | POA: Diagnosis not present

## 2017-06-13 DIAGNOSIS — Z79899 Other long term (current) drug therapy: Secondary | ICD-10-CM | POA: Insufficient documentation

## 2017-06-13 DIAGNOSIS — I10 Essential (primary) hypertension: Secondary | ICD-10-CM

## 2017-06-13 NOTE — ED Triage Notes (Signed)
[facility] headache and htn. Pt reports she is compliant with home htn medication. Pt's bp at time of assessment is 189/82

## 2017-06-14 NOTE — Discharge Instructions (Signed)
Check your blood pressure only as instructed by the family physician.  Give them a call on Monday and let them know what your readings have been.

## 2017-06-14 NOTE — ED Provider Notes (Signed)
[facility] DEPT Provider Note   CSN: [id number] Arrival date & time: 06/13/17  1945     History   Chief Complaint Chief Complaint  Patient presents with  . Hypertension    HPI Kimberly Bonilla is a 56 y.o. female.  56 yo F with a chief complaint of a tingling feeling to her head.  The patient thought this must mean that her blood pressure was elevated and so it was.  She went to an urgent care center where they were concerned about a headache and hypertension and sent her here.  She has been compliant with her medications.  Denies unilateral numbness or weakness.  Denies chest pain or shortness of breath.  Has chronic lower extremity edema but no worsening.   The history is provided by the patient.  Hypertension  This is a chronic problem. The current episode started more than 1 week ago. The problem occurs constantly. The problem has not changed since onset.Pertinent negatives include no chest pain, no headaches and no shortness of breath. Nothing aggravates the symptoms. Nothing relieves the symptoms. She has tried nothing for the symptoms. The treatment provided no relief.    Past Medical History:  Diagnosis Date  . Anemia   . Fibroids    h/o  . H/O cyst of breast   . Hyperlipidemia    controlled w/medication  . Hypertension     Patient Active Problem List   Diagnosis Date Noted  . Hypertension 09/03/2012  . Overweight (BMI 25.0-29.9) 09/03/2012    Past Surgical History:  Procedure Laterality Date  . CYSTECTOMY  [id number]   in breast    OB History    Gravida Para Term Preterm AB Living   1         1   SAB TAB Ectopic Multiple Live Births                   Home Medications    Prior to Admission medications   Medication Sig Start Date End Date Taking? Authorizing Provider  acetaminophen-codeine (TYLENOL #3) 300-30 MG tablet Take 1-2 tablets by mouth every 6 (six) hours as needed for moderate pain. 11/10/15   Shawnee Knapp, MD    amLODipine-benazepril (LOTREL) 5-10 MG capsule Take 1 capsule by mouth daily.    [provider]  calcium carbonate 200 MG capsule Take 250 mg by mouth 2 (two) times daily with a meal. Reported on 11/10/2015    [provider]  fish oil-omega-3 fatty acids 1000 MG capsule Take 2 g by mouth daily.    [provider]  lisinopril (PRINIVIL,ZESTRIL) 5 MG tablet Take 5 mg by mouth daily.    [provider]  Multiple Vitamin (MULTIVITAMIN WITH MINERALS) TABS Take 1 tablet by mouth daily.    [provider]  simvastatin (ZOCOR) 20 MG tablet Take 20 mg by mouth every evening. Reported on 11/10/2015    [provider]    Family History Family History  Problem Relation Age of Onset  . Diabetes Mother   . Cancer Mother        uterine  . Hypertension Mother   . Diabetes Sister   . Parkinson's disease Sister     Social History Social History   Tobacco Use  . Smoking status: Never Smoker  . Smokeless tobacco: Never Used  Substance Use Topics  . Alcohol use: No  . Drug use: No     Allergies   Patient has no  known allergies.   Review of Systems Review of Systems  Constitutional: Negative for chills and fever.  HENT: Negative for congestion and rhinorrhea.   Eyes: Negative for redness and visual disturbance.  Respiratory: Negative for shortness of breath and wheezing.   Cardiovascular: Negative for chest pain and palpitations.  Gastrointestinal: Negative for nausea and vomiting.  Genitourinary: Negative for dysuria and urgency.  Musculoskeletal: Negative for arthralgias and myalgias.  Skin: Negative for pallor and wound.  Neurological: Negative for dizziness and headaches.     Physical Exam Updated Vital Signs BP (!) 167/85 (BP Location: Left Arm)   Pulse 89   Temp 98.9 F (37.2 C) (Oral)   Resp 18   Ht 5\' 6"  (1.676 m)   Wt 82.6 kg (182 lb)   SpO2 100%   BMI 29.38 kg/m   Physical Exam  Constitutional: She is oriented  to person, place, and time. She appears well-developed and well-nourished. No distress.  HENT:  Head: Normocephalic and atraumatic.  Eyes: EOM are normal. Pupils are equal, round, and reactive to light.  Neck: Normal range of motion. Neck supple.  Cardiovascular: Normal rate and regular rhythm. Exam reveals no gallop and no friction rub.  No murmur heard. Pulmonary/Chest: Effort normal. She has no wheezes. She has no rales.  Abdominal: Soft. She exhibits no distension. There is no tenderness.  Musculoskeletal: She exhibits no edema or tenderness.  Neurological: She is alert and oriented to person, place, and time. She has normal strength. No cranial nerve deficit or sensory deficit. She displays a negative Romberg sign. Coordination and gait normal. GCS eye subscore is 4. GCS verbal subscore is 5. GCS motor subscore is 6. She displays no Babinski's sign on the right side. She displays no Babinski's sign on the left side.  Benign neuro exam  Skin: Skin is warm and dry. She is not diaphoretic.  Psychiatric: She has a normal mood and affect. Her behavior is normal.  Nursing note and vitals reviewed.    ED Treatments / Results  Labs (all labs ordered are listed, but only abnormal results are displayed) Labs Reviewed - No data to display  EKG  EKG Interpretation None       Radiology No results found.  Procedures Procedures (including critical care time)  Medications Ordered in ED Medications - No data to display   Initial Impression / Assessment and Plan / ED Course  I have reviewed the triage vital signs and the nursing notes.  Pertinent labs & imaging results that were available during my care of the patient were reviewed by me and considered in my medical decision making (see chart for details).     56 yo  F with a chief complaint of high blood pressure.  The patient said that she felt some tingling in her hands she knew her blood pressure was high.  Checked it at home  and it was elevated.  [facility] and sent her to the emergency department.  Patient has a benign neurologic exam.  She otherwise has no symptoms other than the tingling to her scalp.  I do not feel that this is a hypertensive emergency.  We will have the patient follow-up with her PCP.  Take her blood pressure medicine as prescribed.  Patient asymptomatic with no noted s/s of end organ damage.  No chest pain, diaphoresis, nausea or other acs symptoms.  No headache or neurologic complaints,  no unequal pulses, normal pulse ox without  rales or sob.  Feel this is unlikely to be a Hypertensive Emergency and recent studies suggest no benefit for inpatient admission.  There are also no studies to my knowledge suggesting that patients with hypertensive urgency have increased risk for end organ disease.The patient will follow up closely with their PCP.  Compliance with their medication stressed.    Lowell Guitar, Cicero Duck EH, et al. Characteristics and outcomes of patients presenting with hypertensive urgency in the office setting. JAMA Intern Med. 2016 Jul 1; 176(7): 981-8.   12:30 AM:  I have discussed the diagnosis/risks/treatment options with the patient and family and believe the pt to be eligible for discharge home to follow-up with PCP. We also discussed returning to the ED immediately if new or worsening sx occur. We discussed the sx which are most concerning (e.g., sudden worsening pain, fever, inability to tolerate by mouth) that necessitate immediate return. Medications administered to the patient during their visit and any new prescriptions provided to the patient are listed below.  Medications given during this visit Medications - No data to display   The patient appears reasonably screen and/or stabilized for discharge and I doubt any other medical condition or other [facility] requiring further screening, evaluation, or treatment in the ED at this time prior  to discharge.    Final Clinical Impressions(s) / ED Diagnoses   Final diagnoses:  Essential hypertension    ED Discharge Orders    None       Deno Etienne, DO 06/14/17 0031

## 2017-07-31 DIAGNOSIS — L918 Other hypertrophic disorders of the skin: Secondary | ICD-10-CM | POA: Diagnosis not present

## 2017-11-05 ENCOUNTER — Other Ambulatory Visit (HOSPITAL_COMMUNITY): Payer: Self-pay | Admitting: Obstetrics and Gynecology

## 2017-11-05 DIAGNOSIS — N950 Postmenopausal bleeding: Secondary | ICD-10-CM

## 2017-11-05 DIAGNOSIS — D259 Leiomyoma of uterus, unspecified: Secondary | ICD-10-CM | POA: Diagnosis not present

## 2017-11-10 ENCOUNTER — Ambulatory Visit (HOSPITAL_COMMUNITY)
Admission: RE | Admit: 2017-11-10 | Discharge: 2017-11-10 | Disposition: A | Discharge status: Home / Self Care | Payer: BLUE CROSS/BLUE SHIELD | Source: Ambulatory Visit | Attending: Obstetrics and Gynecology | Admitting: Obstetrics and Gynecology

## 2017-11-10 DIAGNOSIS — D250 Submucous leiomyoma of uterus: Secondary | ICD-10-CM | POA: Insufficient documentation

## 2017-11-10 DIAGNOSIS — N950 Postmenopausal bleeding: Secondary | ICD-10-CM

## 2017-11-10 DIAGNOSIS — D259 Leiomyoma of uterus, unspecified: Secondary | ICD-10-CM | POA: Diagnosis not present

## 2017-11-11 DIAGNOSIS — D259 Leiomyoma of uterus, unspecified: Secondary | ICD-10-CM | POA: Diagnosis not present

## 2017-11-11 DIAGNOSIS — N950 Postmenopausal bleeding: Secondary | ICD-10-CM | POA: Diagnosis not present

## 2017-11-18 DIAGNOSIS — E782 Mixed hyperlipidemia: Secondary | ICD-10-CM | POA: Diagnosis not present

## 2017-11-18 DIAGNOSIS — K219 Gastro-esophageal reflux disease without esophagitis: Secondary | ICD-10-CM | POA: Diagnosis not present

## 2017-11-18 DIAGNOSIS — I10 Essential (primary) hypertension: Secondary | ICD-10-CM | POA: Diagnosis not present

## 2017-11-18 DIAGNOSIS — Z0000 Encounter for general adult medical examination without abnormal findings: Secondary | ICD-10-CM | POA: Diagnosis not present

## 2017-12-12 DIAGNOSIS — N840 Polyp of corpus uteri: Secondary | ICD-10-CM | POA: Diagnosis not present

## 2017-12-12 DIAGNOSIS — N841 Polyp of cervix uteri: Secondary | ICD-10-CM | POA: Diagnosis not present

## 2017-12-12 DIAGNOSIS — N950 Postmenopausal bleeding: Secondary | ICD-10-CM | POA: Diagnosis not present

## 2017-12-12 DIAGNOSIS — D259 Leiomyoma of uterus, unspecified: Secondary | ICD-10-CM | POA: Diagnosis not present

## 2018-01-12 DIAGNOSIS — Z01411 Encounter for gynecological examination (general) (routine) with abnormal findings: Secondary | ICD-10-CM | POA: Diagnosis not present

## 2018-01-12 DIAGNOSIS — Z1231 Encounter for screening mammogram for malignant neoplasm of breast: Secondary | ICD-10-CM | POA: Diagnosis not present

## 2018-01-12 DIAGNOSIS — Z6829 Body mass index (BMI) 29.0-29.9, adult: Secondary | ICD-10-CM | POA: Diagnosis not present

## 2018-01-12 DIAGNOSIS — D259 Leiomyoma of uterus, unspecified: Secondary | ICD-10-CM | POA: Diagnosis not present

## 2018-01-12 DIAGNOSIS — Z124 Encounter for screening for malignant neoplasm of cervix: Secondary | ICD-10-CM | POA: Diagnosis not present

## 2018-04-01 DIAGNOSIS — F331 Major depressive disorder, recurrent, moderate: Secondary | ICD-10-CM | POA: Diagnosis not present

## 2018-05-21 DIAGNOSIS — E782 Mixed hyperlipidemia: Secondary | ICD-10-CM | POA: Diagnosis not present

## 2018-05-21 DIAGNOSIS — I10 Essential (primary) hypertension: Secondary | ICD-10-CM | POA: Diagnosis not present

## 2018-05-21 DIAGNOSIS — K219 Gastro-esophageal reflux disease without esophagitis: Secondary | ICD-10-CM | POA: Diagnosis not present

## 2018-05-21 DIAGNOSIS — Z23 Encounter for immunization: Secondary | ICD-10-CM | POA: Diagnosis not present

## 2018-08-22 DIAGNOSIS — Z136 Encounter for screening for cardiovascular disorders: Secondary | ICD-10-CM | POA: Diagnosis not present

## 2018-08-22 DIAGNOSIS — I10 Essential (primary) hypertension: Secondary | ICD-10-CM | POA: Diagnosis not present

## 2018-08-22 DIAGNOSIS — Z13220 Encounter for screening for lipoid disorders: Secondary | ICD-10-CM | POA: Diagnosis not present

## 2018-08-22 DIAGNOSIS — Z713 Dietary counseling and surveillance: Secondary | ICD-10-CM | POA: Diagnosis not present

## 2018-12-03 DIAGNOSIS — I10 Essential (primary) hypertension: Secondary | ICD-10-CM | POA: Diagnosis not present

## 2018-12-03 DIAGNOSIS — Z0000 Encounter for general adult medical examination without abnormal findings: Secondary | ICD-10-CM | POA: Diagnosis not present

## 2018-12-03 DIAGNOSIS — E782 Mixed hyperlipidemia: Secondary | ICD-10-CM | POA: Diagnosis not present

## 2018-12-03 DIAGNOSIS — K219 Gastro-esophageal reflux disease without esophagitis: Secondary | ICD-10-CM | POA: Diagnosis not present

## 2018-12-31 DIAGNOSIS — I10 Essential (primary) hypertension: Secondary | ICD-10-CM | POA: Diagnosis not present

## 2018-12-31 DIAGNOSIS — E782 Mixed hyperlipidemia: Secondary | ICD-10-CM | POA: Diagnosis not present

## 2019-01-15 DIAGNOSIS — Z1231 Encounter for screening mammogram for malignant neoplasm of breast: Secondary | ICD-10-CM | POA: Diagnosis not present

## 2019-01-15 DIAGNOSIS — Z6829 Body mass index (BMI) 29.0-29.9, adult: Secondary | ICD-10-CM | POA: Diagnosis not present

## 2019-01-15 DIAGNOSIS — Z01419 Encounter for gynecological examination (general) (routine) without abnormal findings: Secondary | ICD-10-CM | POA: Diagnosis not present

## 2019-01-15 DIAGNOSIS — Z124 Encounter for screening for malignant neoplasm of cervix: Secondary | ICD-10-CM | POA: Diagnosis not present

## 2019-02-03 IMAGING — US US PELVIS COMPLETE TRANSABD/TRANSVAG
1 series · 15 of 25 positions shown · non-contrast
Comparison: None

CLINICAL DATA: Postmenopausal bleeding



[Series 1: us pelvis complete transabd/transvag · 15 of 38 slices shown]
[im 1/38]
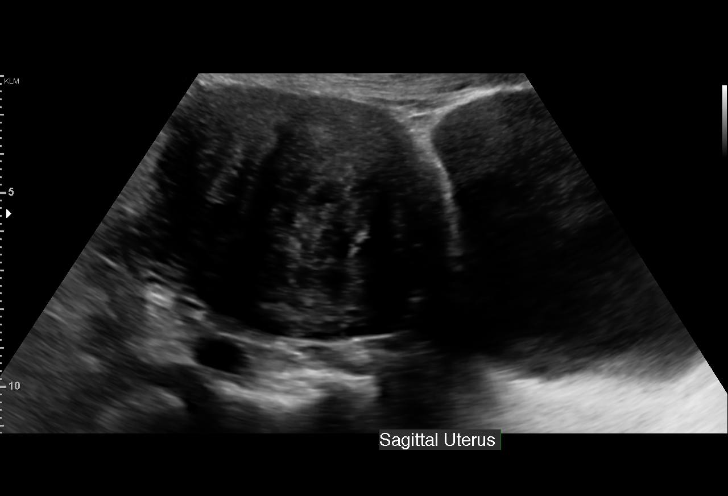
[im 4/38]
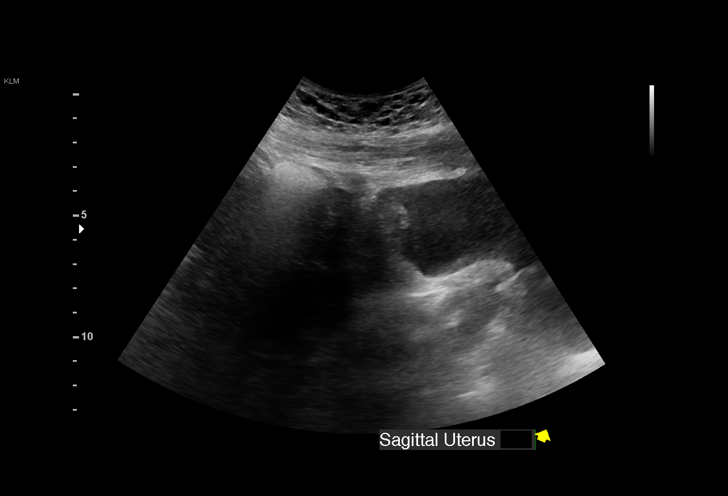
[im 7/38]
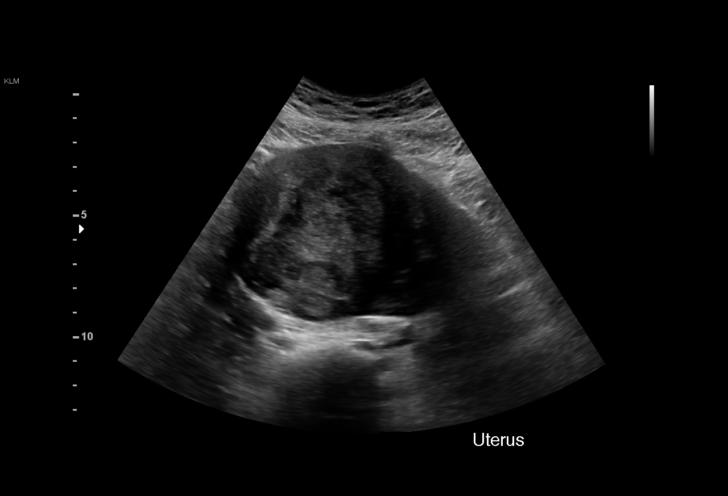
[im 8/38]
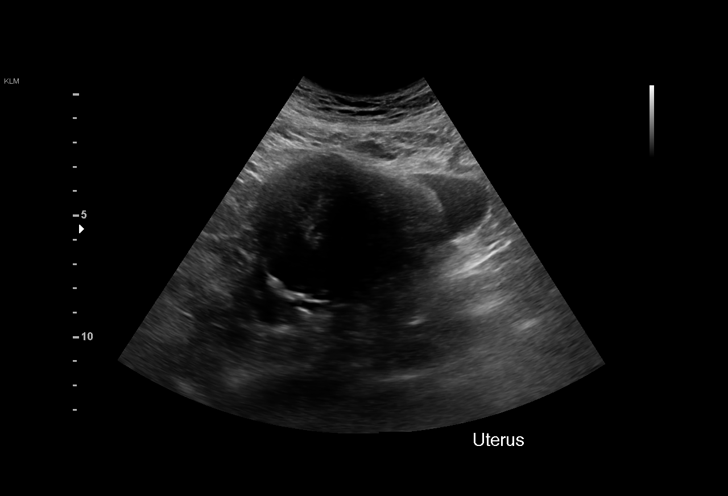
[im 11/38]
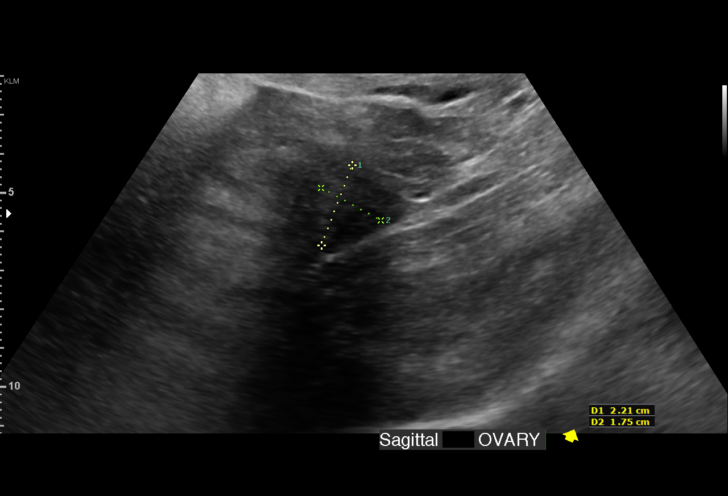
[im 14/38]
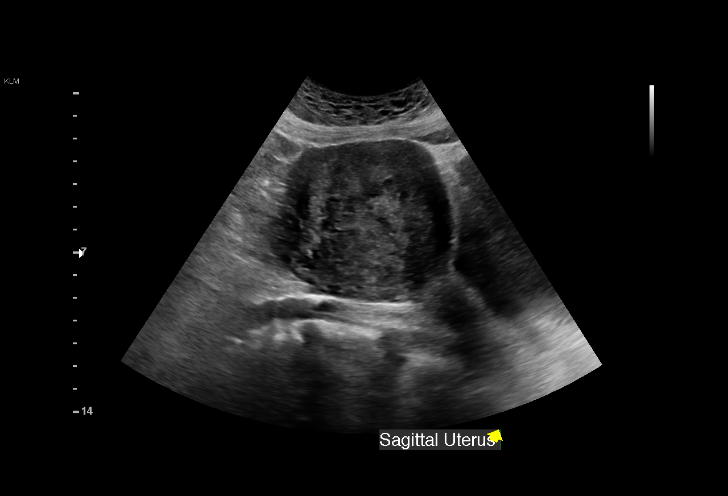
[im 16/38]
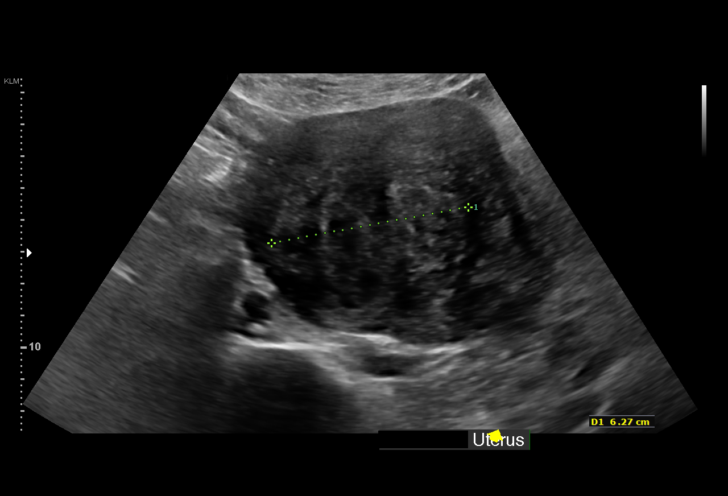
[im 19/38]
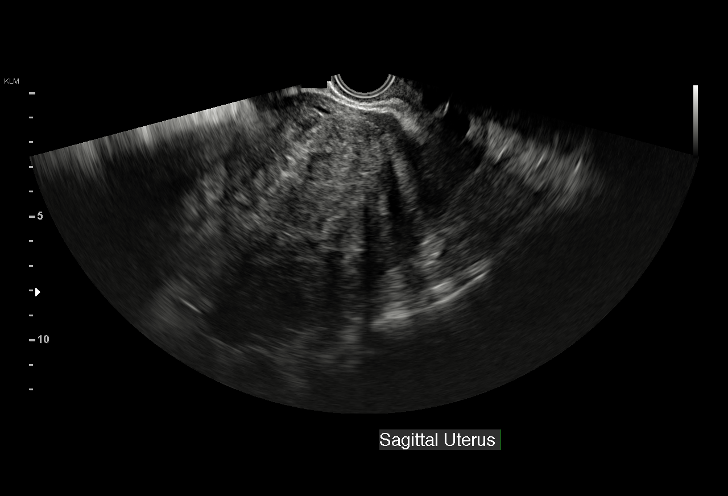
[im 22/38]
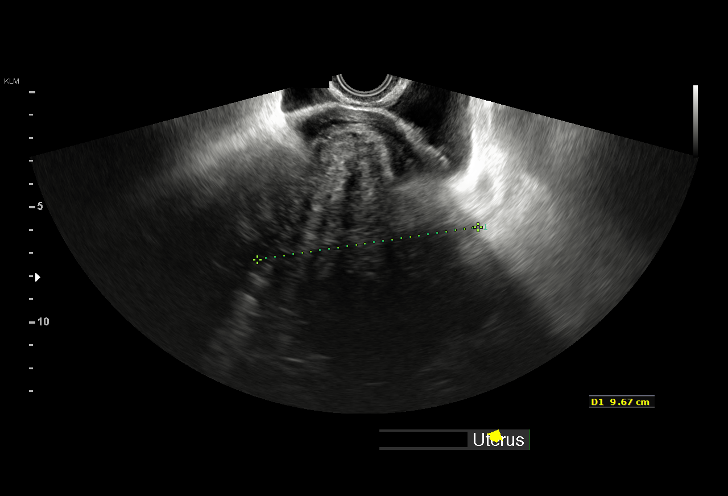
[im 24/38]
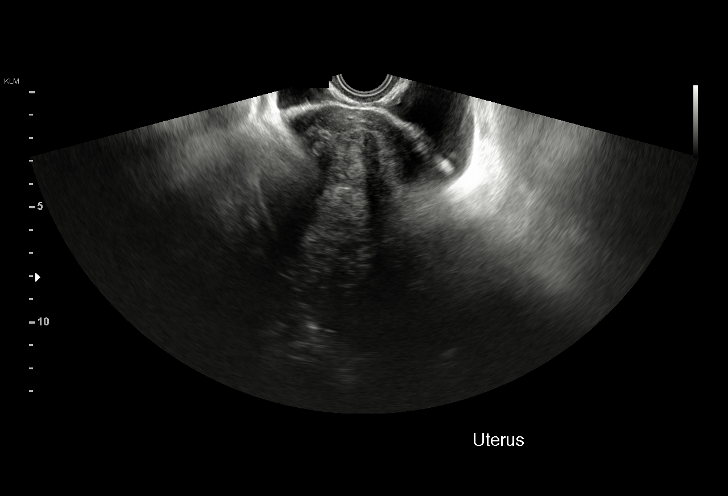
[im 27/38]
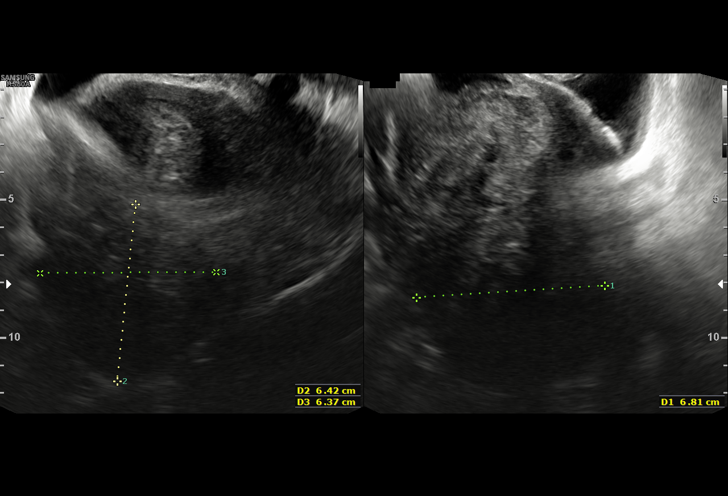
[im 30/38]
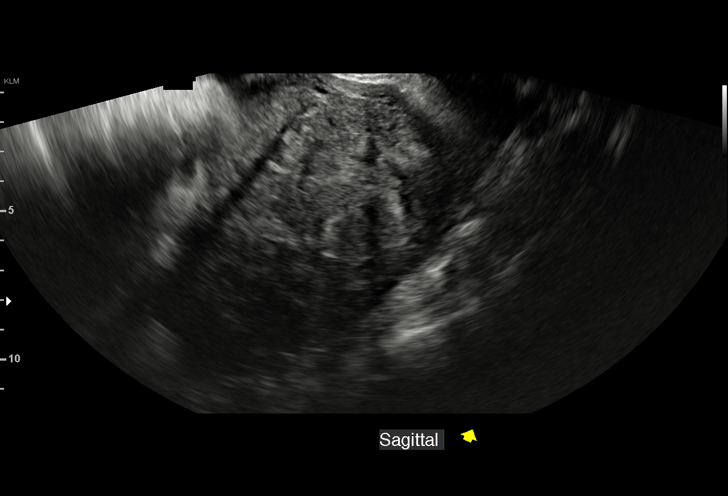
[im 31/38]
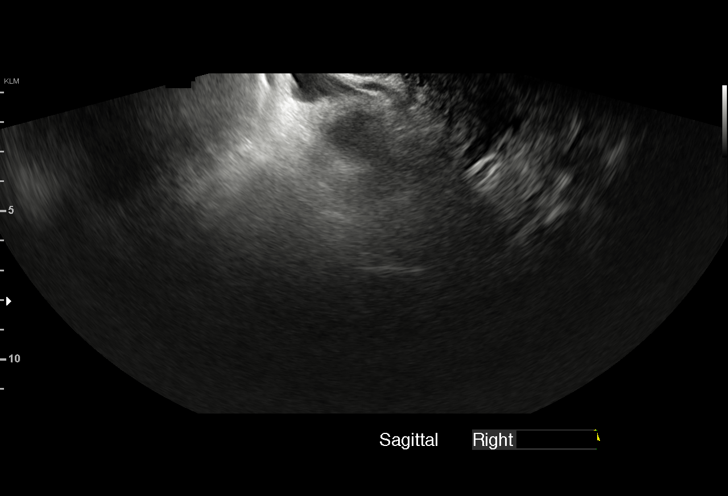
[im 34/38]
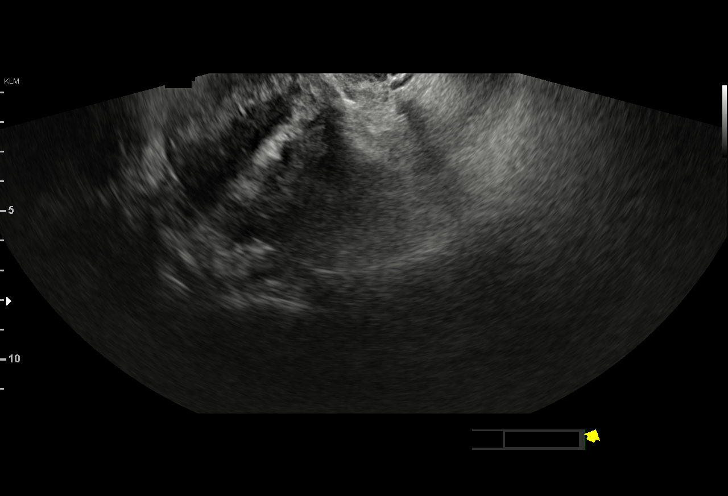
[im 38/38]
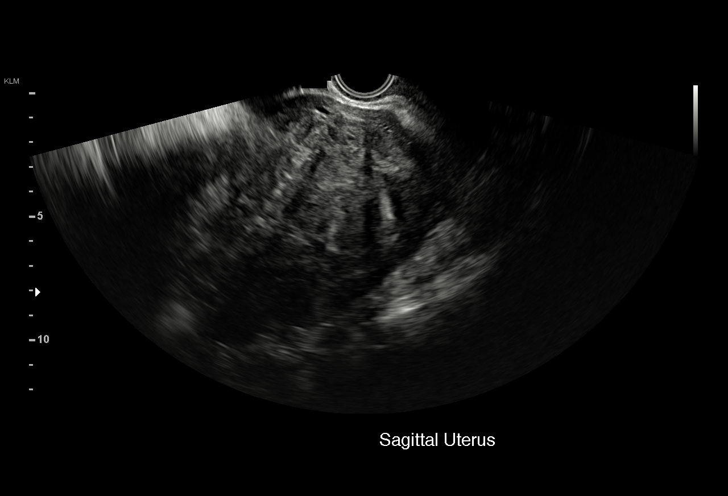

[15 of 25 positions shown; findings below may reference images not displayed]

FINDINGS: Uterus

Measurements: 14.4 x 9.5 x 9.7 cm. Enlarged by at least 2 large
masses most consistent with leiomyomata.. Large fibroid at midline
of upper uterus 6.4 x 6.4 x 6.8 cm, submucosal. Additional posterior
LEFT leiomyoma 6.6 x 6.4 x 5.7 cm, submucosal. Remaining uterus is
heterogeneous in echogenicity, additional small fibroids not
excluded.

Endometrium

Not visualized, likely obscured/distorted by uterine
masses/leiomyomata.

Right ovary

Measurements: 2.2 x 1.6 x 1.4 cm.  Normal morphology without mass

Left ovary

Measurements: 2.2 x 1.8 x 1.0 cm.  Normal morphology without mass

Other findings

No free pelvic fluid or adnexal masses.
IMPRESSION: Enlarged uterus containing at least 2 large leiomyomata which
measure up to 6.8 cm and 6.6 cm in greatest sizes.

Nonvisualization of the endometrial complex, likely
obscured/distorted by submucosal uterine leiomyomata.

## 2019-06-02 DIAGNOSIS — K219 Gastro-esophageal reflux disease without esophagitis: Secondary | ICD-10-CM | POA: Diagnosis not present

## 2019-06-02 DIAGNOSIS — E782 Mixed hyperlipidemia: Secondary | ICD-10-CM | POA: Diagnosis not present

## 2019-06-02 DIAGNOSIS — I10 Essential (primary) hypertension: Secondary | ICD-10-CM | POA: Diagnosis not present

## 2019-06-25 DIAGNOSIS — I10 Essential (primary) hypertension: Secondary | ICD-10-CM | POA: Diagnosis not present

## 2019-12-07 DIAGNOSIS — I10 Essential (primary) hypertension: Secondary | ICD-10-CM | POA: Diagnosis not present

## 2019-12-07 DIAGNOSIS — E782 Mixed hyperlipidemia: Secondary | ICD-10-CM | POA: Diagnosis not present

## 2019-12-07 DIAGNOSIS — Z0000 Encounter for general adult medical examination without abnormal findings: Secondary | ICD-10-CM | POA: Diagnosis not present

## 2019-12-07 DIAGNOSIS — K219 Gastro-esophageal reflux disease without esophagitis: Secondary | ICD-10-CM | POA: Diagnosis not present

## 2019-12-30 ENCOUNTER — Ambulatory Visit (INDEPENDENT_AMBULATORY_CARE_PROVIDER_SITE_OTHER): Payer: PRIVATE HEALTH INSURANCE | Admitting: Psychology

## 2019-12-30 DIAGNOSIS — F411 Generalized anxiety disorder: Secondary | ICD-10-CM | POA: Diagnosis not present

## 2020-01-26 DIAGNOSIS — D259 Leiomyoma of uterus, unspecified: Secondary | ICD-10-CM | POA: Diagnosis not present

## 2020-01-26 DIAGNOSIS — Z6829 Body mass index (BMI) 29.0-29.9, adult: Secondary | ICD-10-CM | POA: Diagnosis not present

## 2020-01-26 DIAGNOSIS — N898 Other specified noninflammatory disorders of vagina: Secondary | ICD-10-CM | POA: Diagnosis not present

## 2020-01-26 DIAGNOSIS — Z01419 Encounter for gynecological examination (general) (routine) without abnormal findings: Secondary | ICD-10-CM | POA: Diagnosis not present

## 2020-02-02 ENCOUNTER — Ambulatory Visit: Payer: PRIVATE HEALTH INSURANCE | Admitting: Psychology

## 2020-03-15 DIAGNOSIS — Z1231 Encounter for screening mammogram for malignant neoplasm of breast: Secondary | ICD-10-CM | POA: Diagnosis not present

## 2020-04-18 DIAGNOSIS — R1030 Lower abdominal pain, unspecified: Secondary | ICD-10-CM | POA: Diagnosis not present

## 2020-04-25 ENCOUNTER — Ambulatory Visit
Admission: RE | Admit: 2020-04-25 | Discharge: 2020-04-25 | Disposition: A | Discharge status: Home / Self Care | Payer: BC Managed Care – PPO | Source: Ambulatory Visit | Attending: Family Medicine | Admitting: Family Medicine

## 2020-04-25 ENCOUNTER — Other Ambulatory Visit: Payer: Self-pay | Admitting: Family Medicine

## 2020-04-25 DIAGNOSIS — R1030 Lower abdominal pain, unspecified: Secondary | ICD-10-CM

## 2020-04-25 DIAGNOSIS — M1611 Unilateral primary osteoarthritis, right hip: Secondary | ICD-10-CM | POA: Diagnosis not present

## 2020-04-28 DIAGNOSIS — Z23 Encounter for immunization: Secondary | ICD-10-CM | POA: Diagnosis not present

## 2020-05-18 DIAGNOSIS — M25551 Pain in right hip: Secondary | ICD-10-CM | POA: Diagnosis not present

## 2020-06-06 DIAGNOSIS — M25651 Stiffness of right hip, not elsewhere classified: Secondary | ICD-10-CM | POA: Diagnosis not present

## 2020-06-06 DIAGNOSIS — M6281 Muscle weakness (generalized): Secondary | ICD-10-CM | POA: Diagnosis not present

## 2020-06-06 DIAGNOSIS — M25451 Effusion, right hip: Secondary | ICD-10-CM | POA: Diagnosis not present

## 2020-06-06 DIAGNOSIS — M25551 Pain in right hip: Secondary | ICD-10-CM | POA: Diagnosis not present

## 2020-06-07 DIAGNOSIS — I10 Essential (primary) hypertension: Secondary | ICD-10-CM | POA: Diagnosis not present

## 2020-06-07 DIAGNOSIS — E782 Mixed hyperlipidemia: Secondary | ICD-10-CM | POA: Diagnosis not present

## 2020-06-07 DIAGNOSIS — K219 Gastro-esophageal reflux disease without esophagitis: Secondary | ICD-10-CM | POA: Diagnosis not present

## 2020-06-13 DIAGNOSIS — M25451 Effusion, right hip: Secondary | ICD-10-CM | POA: Diagnosis not present

## 2020-06-13 DIAGNOSIS — M25551 Pain in right hip: Secondary | ICD-10-CM | POA: Diagnosis not present

## 2020-06-13 DIAGNOSIS — M25651 Stiffness of right hip, not elsewhere classified: Secondary | ICD-10-CM | POA: Diagnosis not present

## 2020-06-13 DIAGNOSIS — M6281 Muscle weakness (generalized): Secondary | ICD-10-CM | POA: Diagnosis not present

## 2020-06-15 DIAGNOSIS — M25551 Pain in right hip: Secondary | ICD-10-CM | POA: Diagnosis not present

## 2020-06-15 DIAGNOSIS — M6281 Muscle weakness (generalized): Secondary | ICD-10-CM | POA: Diagnosis not present

## 2020-06-15 DIAGNOSIS — M25651 Stiffness of right hip, not elsewhere classified: Secondary | ICD-10-CM | POA: Diagnosis not present

## 2020-06-15 DIAGNOSIS — M25451 Effusion, right hip: Secondary | ICD-10-CM | POA: Diagnosis not present

## 2020-06-19 DIAGNOSIS — M6281 Muscle weakness (generalized): Secondary | ICD-10-CM | POA: Diagnosis not present

## 2020-06-19 DIAGNOSIS — M25551 Pain in right hip: Secondary | ICD-10-CM | POA: Diagnosis not present

## 2020-06-19 DIAGNOSIS — M25451 Effusion, right hip: Secondary | ICD-10-CM | POA: Diagnosis not present

## 2020-06-19 DIAGNOSIS — M25651 Stiffness of right hip, not elsewhere classified: Secondary | ICD-10-CM | POA: Diagnosis not present

## 2020-06-21 DIAGNOSIS — M25451 Effusion, right hip: Secondary | ICD-10-CM | POA: Diagnosis not present

## 2020-06-21 DIAGNOSIS — M6281 Muscle weakness (generalized): Secondary | ICD-10-CM | POA: Diagnosis not present

## 2020-06-21 DIAGNOSIS — M25651 Stiffness of right hip, not elsewhere classified: Secondary | ICD-10-CM | POA: Diagnosis not present

## 2020-06-21 DIAGNOSIS — M25551 Pain in right hip: Secondary | ICD-10-CM | POA: Diagnosis not present

## 2020-07-10 DIAGNOSIS — M25551 Pain in right hip: Secondary | ICD-10-CM | POA: Diagnosis not present

## 2020-07-30 DIAGNOSIS — Z20828 Contact with and (suspected) exposure to other viral communicable diseases: Secondary | ICD-10-CM | POA: Diagnosis not present

## 2020-07-30 DIAGNOSIS — Z20822 Contact with and (suspected) exposure to covid-19: Secondary | ICD-10-CM | POA: Diagnosis not present

## 2020-12-12 DIAGNOSIS — E782 Mixed hyperlipidemia: Secondary | ICD-10-CM | POA: Diagnosis not present

## 2020-12-12 DIAGNOSIS — Z6829 Body mass index (BMI) 29.0-29.9, adult: Secondary | ICD-10-CM | POA: Diagnosis not present

## 2020-12-12 DIAGNOSIS — I10 Essential (primary) hypertension: Secondary | ICD-10-CM | POA: Diagnosis not present

## 2020-12-12 DIAGNOSIS — K219 Gastro-esophageal reflux disease without esophagitis: Secondary | ICD-10-CM | POA: Diagnosis not present

## 2020-12-12 DIAGNOSIS — Z0000 Encounter for general adult medical examination without abnormal findings: Secondary | ICD-10-CM | POA: Diagnosis not present

## 2020-12-15 DIAGNOSIS — U071 COVID-19: Secondary | ICD-10-CM | POA: Diagnosis not present

## 2021-01-04 DIAGNOSIS — H43811 Vitreous degeneration, right eye: Secondary | ICD-10-CM | POA: Diagnosis not present

## 2021-01-24 DIAGNOSIS — H40013 Open angle with borderline findings, low risk, bilateral: Secondary | ICD-10-CM | POA: Diagnosis not present

## 2021-01-24 DIAGNOSIS — H43811 Vitreous degeneration, right eye: Secondary | ICD-10-CM | POA: Diagnosis not present

## 2021-01-24 DIAGNOSIS — H25813 Combined forms of age-related cataract, bilateral: Secondary | ICD-10-CM | POA: Diagnosis not present

## 2021-03-16 DIAGNOSIS — Z20822 Contact with and (suspected) exposure to covid-19: Secondary | ICD-10-CM | POA: Diagnosis not present

## 2021-03-19 DIAGNOSIS — Z6829 Body mass index (BMI) 29.0-29.9, adult: Secondary | ICD-10-CM | POA: Diagnosis not present

## 2021-03-19 DIAGNOSIS — Z01419 Encounter for gynecological examination (general) (routine) without abnormal findings: Secondary | ICD-10-CM | POA: Diagnosis not present

## 2021-03-19 DIAGNOSIS — Z1231 Encounter for screening mammogram for malignant neoplasm of breast: Secondary | ICD-10-CM | POA: Diagnosis not present

## 2021-05-04 DIAGNOSIS — Z03818 Encounter for observation for suspected exposure to other biological agents ruled out: Secondary | ICD-10-CM | POA: Diagnosis not present

## 2021-05-04 DIAGNOSIS — J069 Acute upper respiratory infection, unspecified: Secondary | ICD-10-CM | POA: Diagnosis not present

## 2021-05-04 DIAGNOSIS — R059 Cough, unspecified: Secondary | ICD-10-CM | POA: Diagnosis not present

## 2021-05-04 DIAGNOSIS — J029 Acute pharyngitis, unspecified: Secondary | ICD-10-CM | POA: Diagnosis not present

## 2021-06-12 DIAGNOSIS — I10 Essential (primary) hypertension: Secondary | ICD-10-CM | POA: Diagnosis not present

## 2021-06-12 DIAGNOSIS — E782 Mixed hyperlipidemia: Secondary | ICD-10-CM | POA: Diagnosis not present

## 2021-06-12 DIAGNOSIS — E669 Obesity, unspecified: Secondary | ICD-10-CM | POA: Diagnosis not present

## 2021-06-12 DIAGNOSIS — J069 Acute upper respiratory infection, unspecified: Secondary | ICD-10-CM | POA: Diagnosis not present

## 2021-06-23 DIAGNOSIS — Z03818 Encounter for observation for suspected exposure to other biological agents ruled out: Secondary | ICD-10-CM | POA: Diagnosis not present

## 2021-06-23 DIAGNOSIS — R052 Subacute cough: Secondary | ICD-10-CM | POA: Diagnosis not present

## 2021-06-28 DIAGNOSIS — I10 Essential (primary) hypertension: Secondary | ICD-10-CM | POA: Diagnosis not present

## 2021-07-19 IMAGING — CR DG HIP (WITH OR WITHOUT PELVIS) 2-3V*R*
3 series · 3 of 3 positions shown · non-contrast
Comparison: None.

CLINICAL DATA: Right anterior hip pain for 1 month, no known
injury, initial encounter

EXAM:
DG HIP (WITH OR WITHOUT PELVIS) 2-3V RIGHT

[w pelvis upright]
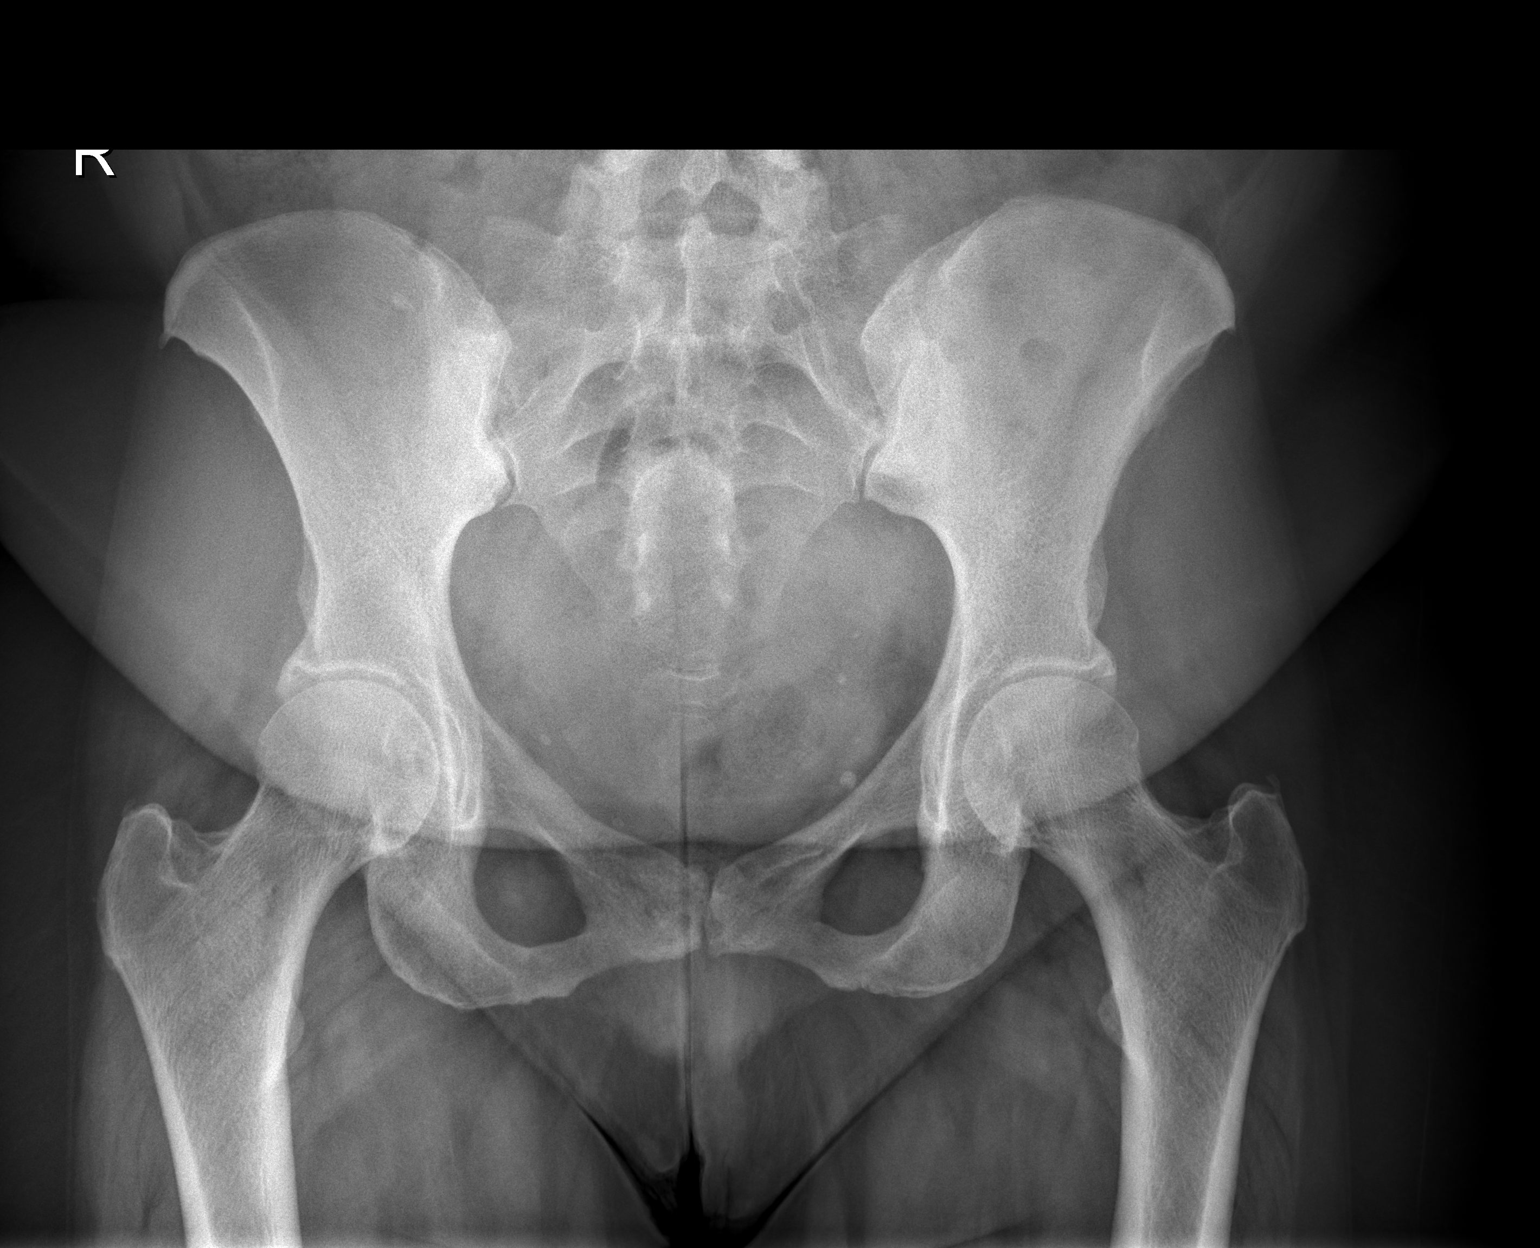

[w hip ap right]
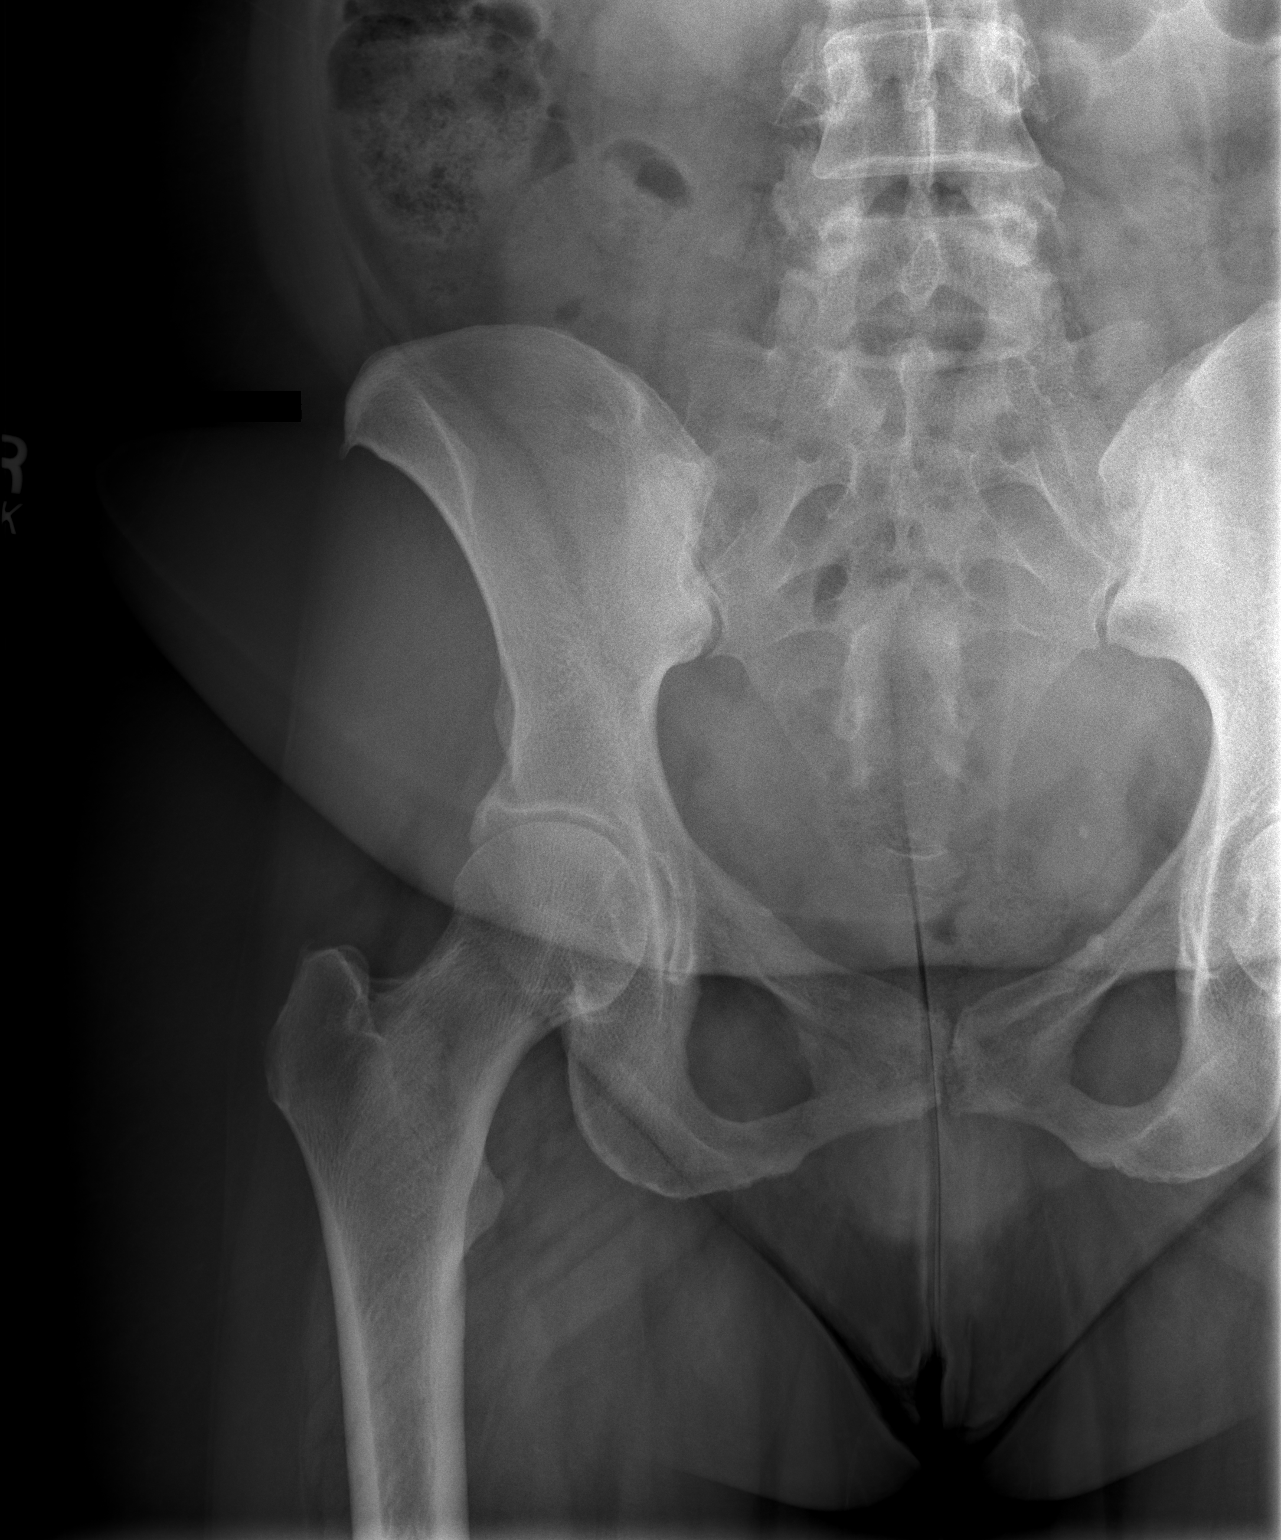

[w hip frog right]
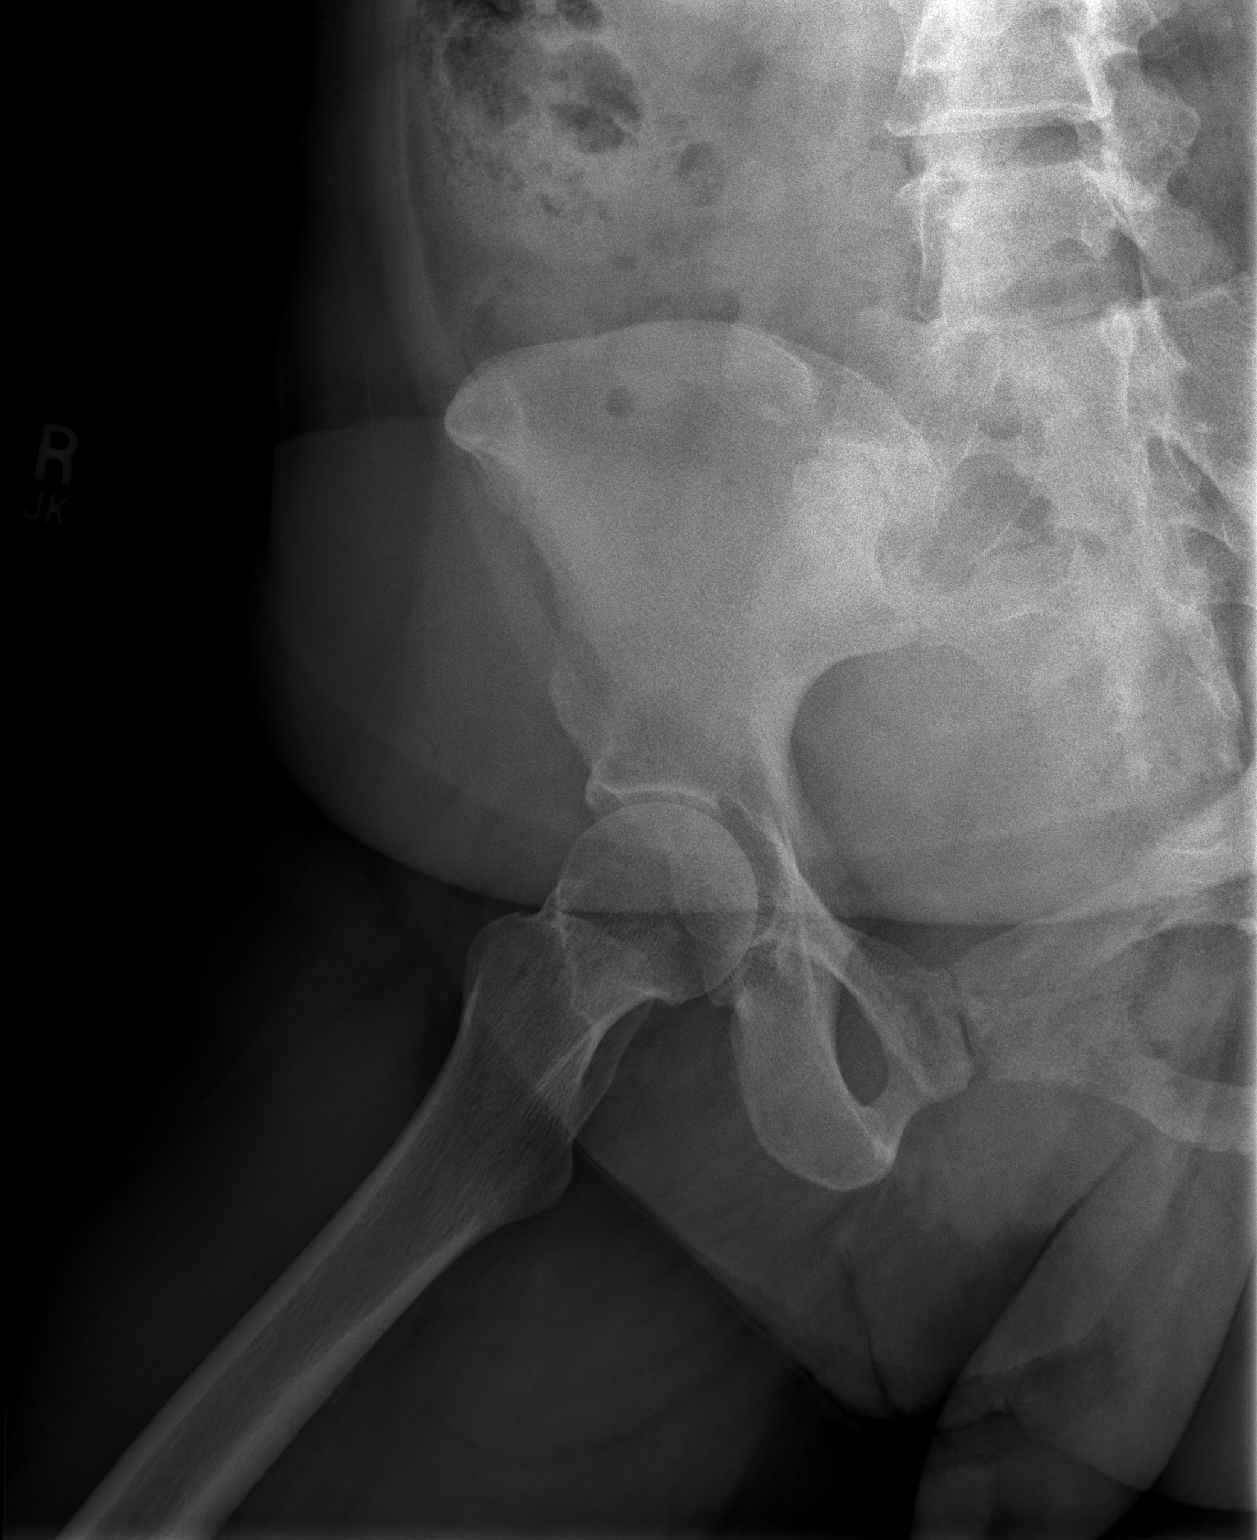

[3 of 3 positions shown; findings below may reference images not displayed]

FINDINGS: Pelvic ring is intact. No acute fracture or dislocation is noted. No
soft tissue abnormality is seen. Degenerative changes of the hip
joints are noted bilaterally.
IMPRESSION: No acute abnormality noted.

## 2021-10-11 DIAGNOSIS — Z03818 Encounter for observation for suspected exposure to other biological agents ruled out: Secondary | ICD-10-CM | POA: Diagnosis not present

## 2021-10-11 DIAGNOSIS — R52 Pain, unspecified: Secondary | ICD-10-CM | POA: Diagnosis not present

## 2021-10-11 DIAGNOSIS — R509 Fever, unspecified: Secondary | ICD-10-CM | POA: Diagnosis not present

## 2021-11-16 DIAGNOSIS — M25561 Pain in right knee: Secondary | ICD-10-CM | POA: Diagnosis not present

## 2022-01-02 DIAGNOSIS — E782 Mixed hyperlipidemia: Secondary | ICD-10-CM | POA: Diagnosis not present

## 2022-01-02 DIAGNOSIS — Z0000 Encounter for general adult medical examination without abnormal findings: Secondary | ICD-10-CM | POA: Diagnosis not present

## 2022-01-02 DIAGNOSIS — Z23 Encounter for immunization: Secondary | ICD-10-CM | POA: Diagnosis not present

## 2022-01-02 DIAGNOSIS — I10 Essential (primary) hypertension: Secondary | ICD-10-CM | POA: Diagnosis not present

## 2022-01-09 DIAGNOSIS — L989 Disorder of the skin and subcutaneous tissue, unspecified: Secondary | ICD-10-CM | POA: Diagnosis not present

## 2022-01-09 DIAGNOSIS — M25551 Pain in right hip: Secondary | ICD-10-CM | POA: Diagnosis not present

## 2022-01-28 DIAGNOSIS — H40013 Open angle with borderline findings, low risk, bilateral: Secondary | ICD-10-CM | POA: Diagnosis not present

## 2022-01-28 DIAGNOSIS — M25551 Pain in right hip: Secondary | ICD-10-CM | POA: Diagnosis not present

## 2022-01-28 DIAGNOSIS — H25813 Combined forms of age-related cataract, bilateral: Secondary | ICD-10-CM | POA: Diagnosis not present

## 2022-01-28 DIAGNOSIS — H43811 Vitreous degeneration, right eye: Secondary | ICD-10-CM | POA: Diagnosis not present

## 2022-01-29 ENCOUNTER — Other Ambulatory Visit: Payer: Self-pay | Admitting: Sports Medicine

## 2022-01-29 DIAGNOSIS — M25551 Pain in right hip: Secondary | ICD-10-CM

## 2022-02-09 ENCOUNTER — Ambulatory Visit
Admission: RE | Admit: 2022-02-09 | Discharge: 2022-02-09 | Disposition: A | Discharge status: Home / Self Care | Payer: BC Managed Care – PPO | Source: Ambulatory Visit | Attending: Sports Medicine | Admitting: Sports Medicine

## 2022-02-09 DIAGNOSIS — M25551 Pain in right hip: Secondary | ICD-10-CM

## 2022-03-27 DIAGNOSIS — M1611 Unilateral primary osteoarthritis, right hip: Secondary | ICD-10-CM | POA: Diagnosis not present

## 2022-03-28 DIAGNOSIS — H43811 Vitreous degeneration, right eye: Secondary | ICD-10-CM | POA: Diagnosis not present

## 2022-03-29 DIAGNOSIS — Z01411 Encounter for gynecological examination (general) (routine) with abnormal findings: Secondary | ICD-10-CM | POA: Diagnosis not present

## 2022-03-29 DIAGNOSIS — Z6828 Body mass index (BMI) 28.0-28.9, adult: Secondary | ICD-10-CM | POA: Diagnosis not present

## 2022-03-29 DIAGNOSIS — Z124 Encounter for screening for malignant neoplasm of cervix: Secondary | ICD-10-CM | POA: Diagnosis not present

## 2022-03-29 DIAGNOSIS — Z1231 Encounter for screening mammogram for malignant neoplasm of breast: Secondary | ICD-10-CM | POA: Diagnosis not present

## 2022-05-21 DIAGNOSIS — J029 Acute pharyngitis, unspecified: Secondary | ICD-10-CM | POA: Diagnosis not present

## 2022-05-26 DIAGNOSIS — R051 Acute cough: Secondary | ICD-10-CM | POA: Diagnosis not present

## 2022-07-23 DIAGNOSIS — R509 Fever, unspecified: Secondary | ICD-10-CM | POA: Diagnosis not present

## 2022-07-23 DIAGNOSIS — M25532 Pain in left wrist: Secondary | ICD-10-CM | POA: Diagnosis not present

## 2022-07-23 DIAGNOSIS — I10 Essential (primary) hypertension: Secondary | ICD-10-CM | POA: Diagnosis not present

## 2022-07-23 DIAGNOSIS — E782 Mixed hyperlipidemia: Secondary | ICD-10-CM | POA: Diagnosis not present

## 2022-07-23 DIAGNOSIS — B349 Viral infection, unspecified: Secondary | ICD-10-CM | POA: Diagnosis not present

## 2022-07-23 DIAGNOSIS — R051 Acute cough: Secondary | ICD-10-CM | POA: Diagnosis not present

## 2022-08-14 DIAGNOSIS — M654 Radial styloid tenosynovitis [de Quervain]: Secondary | ICD-10-CM | POA: Diagnosis not present

## 2022-10-20 DIAGNOSIS — Z03818 Encounter for observation for suspected exposure to other biological agents ruled out: Secondary | ICD-10-CM | POA: Diagnosis not present

## 2022-10-20 DIAGNOSIS — H66003 Acute suppurative otitis media without spontaneous rupture of ear drum, bilateral: Secondary | ICD-10-CM | POA: Diagnosis not present

## 2022-10-20 DIAGNOSIS — R051 Acute cough: Secondary | ICD-10-CM | POA: Diagnosis not present

## 2022-10-20 DIAGNOSIS — R509 Fever, unspecified: Secondary | ICD-10-CM | POA: Diagnosis not present

## 2022-10-20 DIAGNOSIS — R0981 Nasal congestion: Secondary | ICD-10-CM | POA: Diagnosis not present

## 2022-10-20 DIAGNOSIS — J205 Acute bronchitis due to respiratory syncytial virus: Secondary | ICD-10-CM | POA: Diagnosis not present

## 2022-11-03 DIAGNOSIS — M545 Low back pain, unspecified: Secondary | ICD-10-CM | POA: Diagnosis not present

## 2022-11-03 DIAGNOSIS — J205 Acute bronchitis due to respiratory syncytial virus: Secondary | ICD-10-CM | POA: Diagnosis not present

## 2023-01-16 DIAGNOSIS — E782 Mixed hyperlipidemia: Secondary | ICD-10-CM | POA: Diagnosis not present

## 2023-01-16 DIAGNOSIS — K219 Gastro-esophageal reflux disease without esophagitis: Secondary | ICD-10-CM | POA: Diagnosis not present

## 2023-01-16 DIAGNOSIS — I10 Essential (primary) hypertension: Secondary | ICD-10-CM | POA: Diagnosis not present

## 2023-01-16 DIAGNOSIS — Z0000 Encounter for general adult medical examination without abnormal findings: Secondary | ICD-10-CM | POA: Diagnosis not present

## 2023-01-16 DIAGNOSIS — F419 Anxiety disorder, unspecified: Secondary | ICD-10-CM | POA: Diagnosis not present

## 2023-03-04 DIAGNOSIS — Z1211 Encounter for screening for malignant neoplasm of colon: Secondary | ICD-10-CM | POA: Diagnosis not present

## 2023-03-04 DIAGNOSIS — K649 Unspecified hemorrhoids: Secondary | ICD-10-CM | POA: Diagnosis not present

## 2023-03-04 DIAGNOSIS — K5730 Diverticulosis of large intestine without perforation or abscess without bleeding: Secondary | ICD-10-CM | POA: Diagnosis not present

## 2023-05-01 DIAGNOSIS — N393 Stress incontinence (female) (male): Secondary | ICD-10-CM | POA: Diagnosis not present

## 2023-05-01 DIAGNOSIS — Z01411 Encounter for gynecological examination (general) (routine) with abnormal findings: Secondary | ICD-10-CM | POA: Diagnosis not present

## 2023-05-01 DIAGNOSIS — Z1231 Encounter for screening mammogram for malignant neoplasm of breast: Secondary | ICD-10-CM | POA: Diagnosis not present

## 2023-05-27 DIAGNOSIS — H43811 Vitreous degeneration, right eye: Secondary | ICD-10-CM | POA: Diagnosis not present

## 2023-07-16 DIAGNOSIS — R059 Cough, unspecified: Secondary | ICD-10-CM | POA: Diagnosis not present

## 2023-07-16 DIAGNOSIS — J40 Bronchitis, not specified as acute or chronic: Secondary | ICD-10-CM | POA: Diagnosis not present

## 2023-07-16 DIAGNOSIS — J329 Chronic sinusitis, unspecified: Secondary | ICD-10-CM | POA: Diagnosis not present

## 2023-09-16 DIAGNOSIS — J069 Acute upper respiratory infection, unspecified: Secondary | ICD-10-CM | POA: Diagnosis not present

## 2023-09-16 DIAGNOSIS — R509 Fever, unspecified: Secondary | ICD-10-CM | POA: Diagnosis not present

## 2023-12-20 DIAGNOSIS — H9190 Unspecified hearing loss, unspecified ear: Secondary | ICD-10-CM | POA: Diagnosis not present

## 2024-01-02 DIAGNOSIS — H903 Sensorineural hearing loss, bilateral: Secondary | ICD-10-CM | POA: Diagnosis not present

## 2024-01-06 DIAGNOSIS — H43811 Vitreous degeneration, right eye: Secondary | ICD-10-CM | POA: Diagnosis not present

## 2024-01-06 DIAGNOSIS — H40013 Open angle with borderline findings, low risk, bilateral: Secondary | ICD-10-CM | POA: Diagnosis not present

## 2024-01-06 DIAGNOSIS — H25813 Combined forms of age-related cataract, bilateral: Secondary | ICD-10-CM | POA: Diagnosis not present

## 2024-02-02 DIAGNOSIS — Z0000 Encounter for general adult medical examination without abnormal findings: Secondary | ICD-10-CM | POA: Diagnosis not present

## 2024-02-02 DIAGNOSIS — E782 Mixed hyperlipidemia: Secondary | ICD-10-CM | POA: Diagnosis not present

## 2024-02-02 DIAGNOSIS — K219 Gastro-esophageal reflux disease without esophagitis: Secondary | ICD-10-CM | POA: Diagnosis not present

## 2024-02-02 DIAGNOSIS — I10 Essential (primary) hypertension: Secondary | ICD-10-CM | POA: Diagnosis not present

## 2024-02-02 DIAGNOSIS — E669 Obesity, unspecified: Secondary | ICD-10-CM | POA: Diagnosis not present

## 2024-02-05 DIAGNOSIS — L03114 Cellulitis of left upper limb: Secondary | ICD-10-CM | POA: Diagnosis not present

## 2024-02-05 DIAGNOSIS — M25522 Pain in left elbow: Secondary | ICD-10-CM | POA: Diagnosis not present

## 2024-02-24 DIAGNOSIS — H25811 Combined forms of age-related cataract, right eye: Secondary | ICD-10-CM | POA: Diagnosis not present

## 2024-02-24 DIAGNOSIS — H2511 Age-related nuclear cataract, right eye: Secondary | ICD-10-CM | POA: Diagnosis not present

## 2024-03-10 ENCOUNTER — Ambulatory Visit: Admitting: Podiatry

## 2024-03-10 ENCOUNTER — Encounter: Payer: Self-pay | Admitting: Podiatry

## 2024-03-10 ENCOUNTER — Other Ambulatory Visit: Payer: Self-pay | Admitting: Podiatry

## 2024-03-10 DIAGNOSIS — M79672 Pain in left foot: Secondary | ICD-10-CM

## 2024-03-10 DIAGNOSIS — B351 Tinea unguium: Secondary | ICD-10-CM | POA: Diagnosis not present

## 2024-03-10 DIAGNOSIS — M79671 Pain in right foot: Secondary | ICD-10-CM | POA: Diagnosis not present

## 2024-03-10 MED ORDER — TERBINAFINE HCL 250 MG PO TABS: 250.0000 mg | ORAL_TABLET | Freq: Every day | ORAL | 1 refills | Status: AC

## 2024-03-10 NOTE — Progress Notes (Signed)
   Subjective:    HPI Presents with complaint of discoloration and thickening of the nails especially on the 2nd and 3rd toes on the left.  Other nails also been slightly discolored.  Tenderness around toes with walking and wearing shoes.   Objective:  Physical Exam   General: AAO x3, NAD  Vascular: DP and PT pulses palpable bilaterally.  Immedate capillary fill time digits. No significant lower extremity edema bilaterally.  Valeri refill time immediate bilaterally  Dermatological: Onychomycotic mycotic changes nails 1 through 5 with discoloration nail and subungual debris and thickening of the nail.Tenderness with walking and wearing shoes.  Skin slightly atrophic with no hair growth lower extremity bilaterally  Neruologic: Light touch intact bilaterally Achilles tendon reflex normal bilaterally  Musculoskeletal: Hammertoes 2 through 5 bilaterally.  Normal muscle strength lower extremity bilaterally  Assessment:  Painful onychomycotic nails 1 through 5 bilaterally. Pain feet b/l     Plan:  - New office visit level 3 for evaluation and management - Discussed with patient topical versus oral medicines for onychomycosis.  Discussed etiology and treatment of the onychomycosis.  She would like to use the oral medication.  Discussed with her that we will do LFTs at intervals to monitor liver. -Rx: Lamisil  250 mg p.o. daily, refill x 1 - Labs ordered today for liver function tests to monitor for any hepatic side effects from the Lamisil .  - Return in 6 weeks for  Lamisil  3

## 2024-03-11 LAB — HEPATIC FUNCTION PANEL
ALT: 15 IU/L (ref 0–32)
AST: 19 IU/L (ref 0–40)
Albumin: 4.2 g/dL (ref 3.9–4.9)
Alkaline Phosphatase: 99 IU/L (ref 44–121)
Bilirubin Total: 0.2 mg/dL (ref 0.0–1.2)
Bilirubin, Direct: 0.1 mg/dL (ref 0.00–0.40)
Total Protein: 6.9 g/dL (ref 6.0–8.5)

## 2024-03-14 DIAGNOSIS — H6121 Impacted cerumen, right ear: Secondary | ICD-10-CM | POA: Diagnosis not present

## 2024-03-14 DIAGNOSIS — H60311 Diffuse otitis externa, right ear: Secondary | ICD-10-CM | POA: Diagnosis not present

## 2024-03-14 DIAGNOSIS — U071 COVID-19: Secondary | ICD-10-CM | POA: Diagnosis not present

## 2024-03-31 DIAGNOSIS — H2512 Age-related nuclear cataract, left eye: Secondary | ICD-10-CM | POA: Diagnosis not present

## 2024-04-02 DIAGNOSIS — H2512 Age-related nuclear cataract, left eye: Secondary | ICD-10-CM | POA: Diagnosis not present

## 2024-04-02 DIAGNOSIS — H25812 Combined forms of age-related cataract, left eye: Secondary | ICD-10-CM | POA: Diagnosis not present

## 2024-04-26 ENCOUNTER — Ambulatory Visit: Admitting: Podiatry

## 2024-04-26 DIAGNOSIS — B351 Tinea unguium: Secondary | ICD-10-CM | POA: Diagnosis not present

## 2024-04-26 DIAGNOSIS — M79671 Pain in right foot: Secondary | ICD-10-CM | POA: Diagnosis not present

## 2024-04-26 DIAGNOSIS — M79672 Pain in left foot: Secondary | ICD-10-CM | POA: Diagnosis not present

## 2024-04-26 MED ORDER — TERBINAFINE HCL 250 MG PO TABS: 250.0000 mg | ORAL_TABLET | Freq: Every day | ORAL | 0 refills | Status: AC

## 2024-04-26 NOTE — Progress Notes (Signed)
   Subjective:    HPI Presents for follow-up onychomycosis treatment with p.o. Lamisil .  No problems taking medicine with no side effects noted.  Tenderness around toes with walking and wearing shoes.   Objective:  Physical Exam   General: AAO x3, NAD  Vascular: DP and PT pulses palpable bilaterally.  Immedate capillary fill time digits. No significant lower extremity edema bilaterally.  Dermatological: Onychomycotic mycotic changes nails 1 through 5 with discoloration nail and subungual debris and thickening of the nail. 0% Clearance of onychomycotic nail changes noted. Tenderness with walking and wearing shoes.  Neruologic: Grossly intact B/L  Musculoskeletal:   Assessment:  Painful onychomycotic nails 1 through 5 bilaterally. Pain feet b/l     Plan:  -Established office visit level 3 for evaluation and management -Patient is tolerating Lamisil  treatment for onychomycotic nails well.  No side effects noted. Will continue this treatment.  Patient not really realize there was a refill on it so she has only taken 1 month of it so far.  I told her the 2-week Should not affect treatment that much.  If -Rx: Lamisil  250 mg p.o. daily - Labs ordered today for liver function tests to monitor for any hepatic side effects from the Lamisil .  - Return in 6 weeks for  Lamisil  final

## 2024-04-30 DIAGNOSIS — L918 Other hypertrophic disorders of the skin: Secondary | ICD-10-CM | POA: Diagnosis not present

## 2024-05-04 DIAGNOSIS — B351 Tinea unguium: Secondary | ICD-10-CM | POA: Diagnosis not present

## 2024-05-04 LAB — HEPATIC FUNCTION PANEL
AG Ratio: 1.6 (calc) (ref 1.0–2.5)
ALT: 12 U/L (ref 6–29)
AST: 17 U/L (ref 10–35)
Albumin: 4.6 g/dL (ref 3.6–5.1)
Alkaline phosphatase (APISO): 83 U/L (ref 37–153)
Bilirubin, Direct: 0.1 mg/dL (ref 0.0–0.2)
Globulin: 2.8 g/dL (ref 1.9–3.7)
Indirect Bilirubin: 0.6 mg/dL (ref 0.2–1.2)
Total Bilirubin: 0.7 mg/dL (ref 0.2–1.2)
Total Protein: 7.4 g/dL (ref 6.1–8.1)

## 2024-05-17 DIAGNOSIS — R1031 Right lower quadrant pain: Secondary | ICD-10-CM | POA: Diagnosis not present

## 2024-05-17 DIAGNOSIS — M25551 Pain in right hip: Secondary | ICD-10-CM | POA: Diagnosis not present

## 2024-05-25 DIAGNOSIS — M25551 Pain in right hip: Secondary | ICD-10-CM | POA: Diagnosis not present

## 2024-06-07 ENCOUNTER — Ambulatory Visit: Admitting: Podiatry

## 2024-06-07 DIAGNOSIS — M79671 Pain in right foot: Secondary | ICD-10-CM

## 2024-06-07 DIAGNOSIS — M79672 Pain in left foot: Secondary | ICD-10-CM | POA: Diagnosis not present

## 2024-06-07 DIAGNOSIS — B351 Tinea unguium: Secondary | ICD-10-CM | POA: Diagnosis not present

## 2024-06-07 LAB — HEPATIC FUNCTION PANEL
AG Ratio: 1.7 (calc) (ref 1.0–2.5)
ALT: 14 U/L (ref 6–29)
AST: 16 U/L (ref 10–35)
Albumin: 4.3 g/dL (ref 3.6–5.1)
Alkaline phosphatase (APISO): 77 U/L (ref 37–153)
Bilirubin, Direct: 0.1 mg/dL (ref 0.0–0.2)
Globulin: 2.6 g/dL (ref 1.9–3.7)
Indirect Bilirubin: 0.3 mg/dL (ref 0.2–1.2)
Total Bilirubin: 0.4 mg/dL (ref 0.2–1.2)
Total Protein: 6.9 g/dL (ref 6.1–8.1)

## 2024-06-07 NOTE — Progress Notes (Signed)
   Subjective:    HPI Presents for follow-up onychomycosis treatment with p.o. Lamisil .  No problems taking medicine with no side effects noted.  Tenderness around toes with walking and wearing shoes.   Objective:  Physical Exam   General: AAO x3, NAD  Vascular: DP and PT pulses palpable bilaterally.  Immedate capillary fill time digits. No significant lower extremity edema bilaterally.  Dermatological: Onychomycotic mycotic changes nails 1 through 5 with discoloration nail and subungual debris and thickening of the nail. 20% Clearance of onychomycotic nail changes noted. Tenderness with walking and wearing shoes.  Neruologic: Grossly intact B/L  Musculoskeletal:   Assessment:  Painful onychomycotic nails 1 through 5 bilaterally. Pain feet b/l     Plan:  -Established office visit level 3 for evaluation and management -Patient is tolerating Lamisil  treatment for onychomycotic nails well.  No side effects noted. Will continue this treatment. -Labs ordered today for liver function tests to monitor for any hepatic side effects from the Lamisil . -Will probably take another 5 to 6 months for the nails to completely clear.  If not cleared by then, call for reassessment.  Return as needed

## 2024-06-21 DIAGNOSIS — M25551 Pain in right hip: Secondary | ICD-10-CM | POA: Diagnosis not present

## 2024-06-24 DIAGNOSIS — J40 Bronchitis, not specified as acute or chronic: Secondary | ICD-10-CM | POA: Diagnosis not present

## 2024-06-24 DIAGNOSIS — H6591 Unspecified nonsuppurative otitis media, right ear: Secondary | ICD-10-CM | POA: Diagnosis not present

## 2024-06-24 DIAGNOSIS — R051 Acute cough: Secondary | ICD-10-CM | POA: Diagnosis not present
